# Patient Record
Sex: Female | Born: 1965 | Race: Black or African American | Hispanic: No | Marital: Single | Smoking: Current every day smoker
Health system: Southern US, Community
[De-identification: ages and names within clinical notes are randomized; demographics above are authoritative.]

## PROBLEM LIST (undated history)

## (undated) DIAGNOSIS — I10 Essential (primary) hypertension: Secondary | ICD-10-CM

## (undated) DIAGNOSIS — G43909 Migraine, unspecified, not intractable, without status migrainosus: Secondary | ICD-10-CM

## (undated) HISTORY — DX: Essential (primary) hypertension: I10

## (undated) HISTORY — PX: CHOLECYSTECTOMY: SHX55

---

## 2006-01-08 ENCOUNTER — Ambulatory Visit: Payer: Self-pay | Admitting: Emergency Medicine

## 2006-01-08 ENCOUNTER — Emergency Department: Payer: Self-pay | Admitting: Emergency Medicine

## 2006-01-18 ENCOUNTER — Emergency Department: Payer: Self-pay | Admitting: Unknown Physician Specialty

## 2007-07-30 ENCOUNTER — Emergency Department: Payer: Self-pay | Admitting: Emergency Medicine

## 2007-08-18 ENCOUNTER — Emergency Department: Payer: Self-pay | Admitting: Emergency Medicine

## 2008-02-09 ENCOUNTER — Emergency Department: Payer: Self-pay | Admitting: Emergency Medicine

## 2008-05-18 ENCOUNTER — Emergency Department: Payer: Self-pay | Admitting: Emergency Medicine
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
## 2008-09-30 ENCOUNTER — Emergency Department: Payer: Self-pay | Admitting: Emergency Medicine

## 2009-04-08 ENCOUNTER — Emergency Department: Payer: Self-pay | Admitting: Emergency Medicine

## 2010-10-19 ENCOUNTER — Emergency Department: Payer: Self-pay | Admitting: Emergency Medicine

## 2011-02-22 ENCOUNTER — Emergency Department: Payer: Self-pay | Admitting: *Deleted

## 2011-02-24 ENCOUNTER — Emergency Department: Payer: Self-pay | Admitting: Unknown Physician Specialty

## 2011-12-05 ENCOUNTER — Inpatient Hospital Stay: Payer: Self-pay | Admitting: Psychiatry

## 2011-12-05 LAB — ETHANOL: Ethanol %: <0.003 % (ref 0.000–0.080)

## 2011-12-05 LAB — DRUG SCREEN, URINE
Amphetamines, Ur Screen: NEGATIVE (ref ?–1000)
Barbiturates, Ur Screen: NEGATIVE (ref ?–200)
Cannabinoid 50 Ng, Ur ~~LOC~~: NEGATIVE (ref ?–50)
MDMA (Ecstasy)Ur Screen: NEGATIVE (ref ?–500)
Methadone, Ur Screen: NEGATIVE (ref ?–300)
Opiate, Ur Screen: POSITIVE (ref ?–300)
Tricyclic, Ur Screen: NEGATIVE (ref ?–1000)

## 2011-12-05 LAB — COMPREHENSIVE METABOLIC PANEL
Albumin: 4 g/dL (ref 3.4–5.0)
Alkaline Phosphatase: 111 U/L (ref 50–136)
BUN: 14 mg/dL (ref 7–18)
Bilirubin,Total: 0.7 mg/dL (ref 0.2–1.0)
Calcium, Total: 9.3 mg/dL (ref 8.5–10.1)
Co2: 25 mmol/L (ref 21–32)
Creatinine: 0.82 mg/dL (ref 0.60–1.30)
EGFR (African American): >60
SGOT(AST): 30 U/L (ref 15–37)
SGPT (ALT): 33 U/L
Sodium: 139 mmol/L (ref 136–145)

## 2011-12-05 LAB — ACETAMINOPHEN LEVEL: Acetaminophen: <2 ug/mL

## 2011-12-05 LAB — CBC
HGB: 14 g/dL (ref 12.0–16.0)
MCV: 99 fL (ref 80–100)
Platelet: 252 10*3/uL (ref 150–440)
RBC: 4.18 10*6/uL (ref 3.80–5.20)
WBC: 7.4 10*3/uL (ref 3.6–11.0)

## 2011-12-05 LAB — TSH: Thyroid Stimulating Horm: 0.32 u[IU]/mL — ABNORMAL LOW

## 2012-07-09 ENCOUNTER — Emergency Department: Payer: Self-pay | Admitting: Emergency Medicine

## 2013-03-09 ENCOUNTER — Emergency Department: Payer: Self-pay | Admitting: Emergency Medicine

## 2013-06-12 ENCOUNTER — Emergency Department: Payer: Self-pay | Admitting: Emergency Medicine

## 2013-06-12 LAB — COMPREHENSIVE METABOLIC PANEL
Alkaline Phosphatase: 113 U/L (ref 50–136)
Anion Gap: 6 — ABNORMAL LOW (ref 7–16)
BUN: 9 mg/dL (ref 7–18)
Bilirubin,Total: 1.3 mg/dL — ABNORMAL HIGH (ref 0.2–1.0)
Chloride: 108 mmol/L — ABNORMAL HIGH (ref 98–107)
Co2: 22 mmol/L (ref 21–32)
EGFR (African American): >60
EGFR (Non-African Amer.): >60
Glucose: 116 mg/dL — ABNORMAL HIGH (ref 65–99)
Potassium: 3.4 mmol/L — ABNORMAL LOW (ref 3.5–5.1)

## 2013-06-12 LAB — CBC
MCV: 95 fL (ref 80–100)
Platelet: 232 10*3/uL (ref 150–440)
RBC: 4.38 10*6/uL (ref 3.80–5.20)

## 2013-06-12 LAB — ETHANOL
Ethanol %: <0.003 % (ref 0.000–0.080)
Ethanol: <3 mg/dL

## 2013-09-30 ENCOUNTER — Emergency Department: Payer: Self-pay | Admitting: Emergency Medicine

## 2014-10-10 DIAGNOSIS — F17200 Nicotine dependence, unspecified, uncomplicated: Secondary | ICD-10-CM | POA: Diagnosis present

## 2014-11-25 NOTE — H&P (Signed)
PATIENT NAME:  Christina Avery, Christina Avery MR#:  [id number] DATE OF BIRTH:  10-06-1965  DATE OF ADMISSION:  12/05/2011  IDENTIFYING INFORMATION: The patient is a 49 year old African American female not employed and last worked in November of 2012. The patient is single, never married, and lives with her maternal grandmother who is 70 years old. The patient and maternal grandmother live in an apartment with two bedrooms and maternal grandmother rents it through the [employer] for 115 dollars a month. The patient comes for her first inpatient hospitalization on Psychiatry at [facility] with chief complaint "I have been using drugs and I want to get off of it. I have been having problems and I thought I better get here for help. My boyfriend drove me here for help and I have been having conflicts with him".  HISTORY OF PRESENT ILLNESS: When the patient was asked when she last felt well, she reported two days ago. She started having conflicts with her boyfriend. She reports that she has been with the same boyfriend for seven years and started having conflicts for the past few days. She has been feeling depressed and she reported "I can't tell you what exactly caused it. I cannot pick the cause why I feel so sad and down. I have been using cocaine and crack and smoking it for the past 24 years and smoke as much as I can and I started feeling depressed about it and want to get away from it".   PAST PSYCHIATRIC HISTORY: No history of inpatient hospitalization on Psychiatry. History of inpatient hospitalization for drug abuse and substance abuse problems on two occasions before. First inpatient hospitalization was in June of 1992 at [facility] in [place], [place] for 30 days. She stayed sober for less than 24 hours and started abusing crack soon after discharge. No specific stress. Second inpatient hospitalization was in 2008 at [facility] in [facility]. She was inpatient for five months. She  stayed sober for two months and started using crack again. She reported "just using it, no specific reason". Not being followed by any psychiatrist or any substance abuse program at this time.   FAMILY HISTORY OF MENTAL ILLNESS: None for mental illness. No suicides in the family.   FAMILY HISTORY: Raised by maternal grandmother. Mother always worked and never around. Mother is living. Father worked in [employer]. Father died of liver damage and was alcoholic, cannot remember his age. Has three brothers and one sister, close to family.   PERSONAL HISTORY: Born in [place], [place]. Graduated from high school. Went to college for 1-1/2 years for [employer]. No degree.   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 This job lasted for seven years. Quit because of drugs. Longest job she has ever held was at [employer]. 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 Currently employed and last worked on Thursday, 12/03/2011.   MARRIAGES: Never married. Dated a lot. No children. Had a son who died at age 22 years. Has a steady boyfriend for seven years. He is retired and is 36 years old.   ALCOHOL AND DRUGS: First drink of alcohol at age 89 years. No problem with alcohol drinking. No history of DWI's. Never arrested for public drunkenness. Denies smoking THC. Does admit smoking crack for the past 24 years. Smokes as much as she can depending on the money that she has. Last smoked it on 12/04/2011. Does admit smoking nicotine  cigarettes at a rate of a pack a day.   MEDICAL HISTORY: No high blood pressure. No diabetes. Status post two Cesarean sections. Status post cholecystectomy. Status post surgery for bowel obstruction. No history of motor vehicle accident. Never been unconscious.   ALLERGIES: No known drug allergies.   MEDICATIONS: Not on any medications at this time.   PRIMARY CARE PHYSICIAN: Not being followed by any physician at this time.   PHYSICAL  EXAMINATION:   VITAL SIGNS: Temperature 98.7, pulse 120 per minute and regular, respirations 20 and regular, blood pressure 140/94 mmHg.   HEENT: Head is normocephalic and atraumatic. Pupils equal, round, and reactive to light and accommodation. Fundi bilaterally benign. EOMS visualized. Tympanic membranes visualized. No exudate.   NECK: Supple without any organomegaly or thyromegaly.  CHEST: Normal expansion.   HEART: Normal S1, S2 without any murmurs or gallops.  ABDOMEN: Soft. No organomegaly. Bowel sounds heard.   RECTAL/PELVIC: Deferred.   NEUROLOGIC: Gait is normal. Romberg is negative. Cranial nerves II through XII grossly intact. Deep tendon reflexes 2+. Normal plantars. Normal response.   MENTAL STATUS EXAMINATION: The patient is dressed in hospital pajamas, alert and oriented to place, person, and time. Fully aware of the situation that brought her for admission to [facility]. Affect is appropriate with her mood which is low and down. Does admit feeling depressed. Admits feeling hopeless and helpless about crack addiction. Admits feeling worthless and useless at times about her crack addiction. Denies any ideas to hurt herself or others. No evidence of psychosis. Denies auditory or visual hallucinations. Denies hearing voices or seeing things. Denies paranoid or suspicious ideas. Denies thought insertion or thought control. Denies having any grandiose ideas. Memory is intact for recent and remote events. Recall is good and could remember all the three objects after several minutes. She could spell the word world forward, could not spell it backward. She could count money. Abstract interpretation is fair. General knowledge and information is fair. Cognition intact. Does admit to appetite and sleep disturbance when she's taking too much crack and drinks alcohol on top of it. Denies any ideas to hurt herself or others and wants to get help. Insight and judgment guarded.   IMPRESSION: AXIS  I: 1. Crack dependence, chronic, continuous. 2. Alcohol abuse. 3. Nicotine dependence. 4. Substance-induced mood disorder.  AXIS II: Deferred.   AXIS III: 1. Status post cholecystectomy. 2. Status post surgery for bowel obstruction. 3. Status post two Cesarean sections.  4. Status post surgery for ectopic pregnancy.   AXIS IV: Severe. Long history of crack dependence and problems related to the same which includes occupational, financial, and relationship problems with boyfriend of seven years.  AXIS V: GAF 25.  PLAN: The patient is admitted to [facility] for close observation, evaluation, and help. She will be started on detox on a p.r.n. basis for crack dependence and alcohol abuse. She will be started on antidepressant medication which will help her with her depression and help her rest better. During the stay in the hospital, she will be given milieu therapy and supportive counseling. She will take part in individual and group therapy for substance abuse where focus on crack dependence will be addressed. At the time of discharge, she will be detoxed and her symptoms will be under control. Social Services will consult for programs in the community that will give her help on an outpatient basis for her crack dependence as patient wants to get help on an outpatient basis as she wants  to work during the day. Appropriate follow-up appointments will be made at the time of discharge.  ____________________________ Jannet Mantis. Guss Bunde, MD skc:drc D: 12/05/2011 18:57:57 ET T: 12/06/2011 08:17:43 ET JOB#: [id number]  cc: Monika Salk K. Guss Bunde, MD, <Dictator> Beau Fanny MD ELECTRONICALLY SIGNED 12/06/2011 17:05

## 2015-04-15 ENCOUNTER — Emergency Department
Admission: EM | Admit: 2015-04-15 | Discharge: 2015-04-15 | Disposition: A | Discharge status: Home / Self Care | Payer: BLUE CROSS/BLUE SHIELD | Attending: Emergency Medicine | Admitting: Emergency Medicine

## 2015-04-15 ENCOUNTER — Encounter: Payer: Self-pay | Admitting: Emergency Medicine

## 2015-04-15 DIAGNOSIS — S81811A Laceration without foreign body, right lower leg, initial encounter: Secondary | ICD-10-CM | POA: Insufficient documentation

## 2015-04-15 DIAGNOSIS — Y998 Other external cause status: Secondary | ICD-10-CM | POA: Diagnosis not present

## 2015-04-15 DIAGNOSIS — Z720 Tobacco use: Secondary | ICD-10-CM | POA: Diagnosis not present

## 2015-04-15 DIAGNOSIS — IMO0002 Reserved for concepts with insufficient information to code with codable children: Secondary | ICD-10-CM

## 2015-04-15 DIAGNOSIS — Z23 Encounter for immunization: Secondary | ICD-10-CM | POA: Insufficient documentation

## 2015-04-15 DIAGNOSIS — Y288XXA Contact with other sharp object, undetermined intent, initial encounter: Secondary | ICD-10-CM | POA: Insufficient documentation

## 2015-04-15 DIAGNOSIS — Y9289 Other specified places as the place of occurrence of the external cause: Secondary | ICD-10-CM | POA: Insufficient documentation

## 2015-04-15 DIAGNOSIS — Y9389 Activity, other specified: Secondary | ICD-10-CM | POA: Insufficient documentation

## 2015-04-15 MED ORDER — OXYCODONE-ACETAMINOPHEN 5-325 MG PO TABS
2.0000 | ORAL_TABLET | Freq: Once | ORAL | Status: AC
  Administered 2015-04-15: 2 via ORAL
  Filled 2015-04-15: qty 2

## 2015-04-15 MED ORDER — TETANUS-DIPHTH-ACELL PERTUSSIS 5-2.5-18.5 LF-MCG/0.5 IM SUSP
0.5000 mL | Freq: Once | INTRAMUSCULAR | Status: AC
  Administered 2015-04-15: 0.5 mL via INTRAMUSCULAR
  Filled 2015-04-15: qty 0.5

## 2015-04-15 MED ORDER — LIDOCAINE-EPINEPHRINE 1 %-1:100000 IJ SOLN
10.0000 mL | Freq: Once | INTRAMUSCULAR | Status: DC
  Filled 2015-04-15: qty 10

## 2015-04-15 MED ORDER — IBUPROFEN 800 MG PO TABS: 800.0000 mg | ORAL_TABLET | Freq: Three times a day (TID) | ORAL | Status: DC | PRN

## 2015-04-15 MED ORDER — LIDOCAINE-EPINEPHRINE (PF) 1 %-1:200000 IJ SOLN
INTRAMUSCULAR | Status: AC
  Filled 2015-04-15: qty 30

## 2015-04-15 NOTE — Discharge Instructions (Signed)
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
## 2015-04-15 NOTE — ED Notes (Signed)
Pt got leg caught on metal piece of furniture, lac to lower leg, bleeding controled

## 2015-04-15 NOTE — ED Notes (Signed)
Pt presents with laceration to right leg up near knee. Caught it on a nail sticking out of her couch.

## 2015-12-04 DIAGNOSIS — Y92410 Unspecified street and highway as the place of occurrence of the external cause: Secondary | ICD-10-CM | POA: Insufficient documentation

## 2015-12-04 DIAGNOSIS — Z79899 Other long term (current) drug therapy: Secondary | ICD-10-CM | POA: Insufficient documentation

## 2015-12-04 DIAGNOSIS — S169XXA Unspecified injury of muscle, fascia and tendon at neck level, initial encounter: Secondary | ICD-10-CM | POA: Diagnosis present

## 2015-12-04 DIAGNOSIS — S134XXA Sprain of ligaments of cervical spine, initial encounter: Secondary | ICD-10-CM | POA: Insufficient documentation

## 2015-12-04 DIAGNOSIS — F17200 Nicotine dependence, unspecified, uncomplicated: Secondary | ICD-10-CM | POA: Diagnosis not present

## 2015-12-04 DIAGNOSIS — Y9389 Activity, other specified: Secondary | ICD-10-CM | POA: Diagnosis not present

## 2015-12-04 DIAGNOSIS — Y999 Unspecified external cause status: Secondary | ICD-10-CM | POA: Insufficient documentation
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
## 2015-12-05 ENCOUNTER — Emergency Department
Admission: EM | Admit: 2015-12-05 | Discharge: 2015-12-05 | Disposition: A | Discharge status: Home / Self Care | Payer: BLUE CROSS/BLUE SHIELD | Attending: Emergency Medicine | Admitting: Emergency Medicine

## 2015-12-05 DIAGNOSIS — S134XXA Sprain of ligaments of cervical spine, initial encounter: Secondary | ICD-10-CM

## 2015-12-05 MED ORDER — CARISOPRODOL 350 MG PO TABS: 350.0000 mg | ORAL_TABLET | Freq: Three times a day (TID) | ORAL | Status: DC | PRN

## 2015-12-05 NOTE — Discharge Instructions (Signed)
Cervical Sprain  A cervical sprain is an injury in the neck in which the strong, fibrous tissues (ligaments) that connect your neck bones stretch or tear. Cervical sprains can range from mild to severe. Severe cervical sprains can cause the neck vertebrae to be unstable. This can lead to damage of the spinal cord and can result in serious nervous system problems. The amount of time it takes for a cervical sprain to get better depends on the cause and extent of the injury. Most cervical sprains heal in 1 to 3 weeks.  CAUSES   Severe cervical sprains may be caused by:    Contact sport injuries (such as from football, rugby, wrestling, hockey, auto racing, gymnastics, diving, martial arts, or boxing).    Motor vehicle collisions.    Whiplash injuries. This is an injury from a sudden forward and backward whipping movement of the head and neck.   Falls.   Mild cervical sprains may be caused by:    Being in an awkward position, such as while cradling a telephone between your ear and shoulder.    Sitting in a chair that does not offer proper support.    Working at a poorly designed computer station.    Looking up or down for long periods of time.   SYMPTOMS    Pain, soreness, stiffness, or a burning sensation in the front, back, or sides of the neck. This discomfort may develop immediately after the injury or slowly, 24 hours or more after the injury.    Pain or tenderness directly in the middle of the back of the neck.    Shoulder or upper back pain.    Limited ability to move the neck.    Headache.    Dizziness.    Weakness, numbness, or tingling in the hands or arms.    Muscle spasms.    Difficulty swallowing or chewing.    Tenderness and swelling of the neck.   DIAGNOSIS   Most of the time your health care provider can diagnose a cervical sprain by taking your history and doing a physical exam. Your health care provider will ask about previous neck injuries and any known neck  problems, such as arthritis in the neck. X-rays may be taken to find out if there are any other problems, such as with the bones of the neck. Other tests, such as a CT scan or MRI, may also be needed.   TREATMENT   Treatment depends on the severity of the cervical sprain. Mild sprains can be treated with rest, keeping the neck in place (immobilization), and pain medicines. Severe cervical sprains are immediately immobilized. Further treatment is done to help with pain, muscle spasms, and other symptoms and may include:   Medicines, such as pain relievers, numbing medicines, or muscle relaxants.    Physical therapy. This may involve stretching exercises, strengthening exercises, and posture training. Exercises and improved posture can help stabilize the neck, strengthen muscles, and help stop symptoms from returning.   HOME CARE INSTRUCTIONS    Put ice on the injured area.     Put ice in a plastic bag.     Place a towel between your skin and the bag.     Leave the ice on for 15-20 minutes, 3-4 times a day.    If your injury was severe, you may have been given a cervical collar to wear. A cervical collar is a two-piece collar designed to keep your neck from moving while it heals.      Do not remove the collar unless instructed by your health care provider.    If you have long hair, keep it outside of the collar.    Ask your health care provider before making any adjustments to your collar. Minor adjustments may be required over time to improve comfort and reduce pressure on your chin or on the back of your head.    Ifyou are allowed to remove the collar for cleaning or bathing, follow your health care provider's instructions on how to do so safely.    Keep your collar clean by wiping it with mild soap and water and drying it completely. If the collar you have been given includes removable pads, remove them every 1-2 days and hand wash them with soap and water. Allow them to air dry. They should be completely  dry before you wear them in the collar.    If you are allowed to remove the collar for cleaning and bathing, wash and dry the skin of your neck. Check your skin for irritation or sores. If you see any, tell your health care provider.    Do not drive while wearing the collar.    Only take over-the-counter or prescription medicines for pain, discomfort, or fever as directed by your health care provider.    Keep all follow-up appointments as directed by your health care provider.    Keep all physical therapy appointments as directed by your health care provider.    Make any needed adjustments to your workstation to promote good posture.    Avoid positions and activities that make your symptoms worse.    Warm up and stretch before being active to help prevent problems.   SEEK MEDICAL CARE IF:    Your pain is not controlled with medicine.    You are unable to decrease your pain medicine over time as planned.    Your activity level is not improving as expected.   SEEK IMMEDIATE MEDICAL CARE IF:    You develop any bleeding.   You develop stomach upset.   You have signs of an allergic reaction to your medicine.    Your symptoms get worse.    You develop new, unexplained symptoms.    You have numbness, tingling, weakness, or paralysis in any part of your body.   MAKE SURE YOU:    Understand these instructions.   Will watch your condition.   Will get help right away if you are not doing well or get worse.     This information is not intended to replace advice given to you by your health care provider. Make sure you discuss any questions you have with your health care provider.     Document Released: 05/17/2007 Document Revised: 07/25/2013 Document Reviewed: 01/25/2013  Elsevier Interactive Patient Education 2016 [employer]
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
## 2016-03-18 ENCOUNTER — Emergency Department
Admission: EM | Admit: 2016-03-18 | Discharge: 2016-03-18 | Disposition: A | Discharge status: Home / Self Care | Payer: BLUE CROSS/BLUE SHIELD

## 2016-08-27 ENCOUNTER — Emergency Department
Admission: EM | Admit: 2016-08-27 | Discharge: 2016-08-27 | Disposition: A | Discharge status: Home / Self Care | Payer: BLUE CROSS/BLUE SHIELD | Attending: Emergency Medicine | Admitting: Emergency Medicine

## 2016-08-27 ENCOUNTER — Emergency Department: Payer: BLUE CROSS/BLUE SHIELD

## 2016-08-27 ENCOUNTER — Encounter: Payer: Self-pay | Admitting: Emergency Medicine

## 2016-08-27 DIAGNOSIS — F17200 Nicotine dependence, unspecified, uncomplicated: Secondary | ICD-10-CM | POA: Diagnosis not present

## 2016-08-27 DIAGNOSIS — R51 Headache: Secondary | ICD-10-CM | POA: Insufficient documentation

## 2016-08-27 DIAGNOSIS — Z79899 Other long term (current) drug therapy: Secondary | ICD-10-CM | POA: Insufficient documentation

## 2016-08-27 DIAGNOSIS — R519 Headache, unspecified: Secondary | ICD-10-CM

## 2016-08-27 DIAGNOSIS — Z791 Long term (current) use of non-steroidal anti-inflammatories (NSAID): Secondary | ICD-10-CM | POA: Insufficient documentation

## 2016-08-27 LAB — BASIC METABOLIC PANEL
Anion gap: 6 (ref 5–15)
BUN: 8 mg/dL (ref 6–20)
CALCIUM: 9.2 mg/dL (ref 8.9–10.3)
CO2: 25 mmol/L (ref 22–32)
CREATININE: 0.65 mg/dL (ref 0.44–1.00)
Chloride: 105 mmol/L (ref 101–111)
GFR calc Af Amer: >60 mL/min (ref >60)
GLUCOSE: 94 mg/dL (ref 65–99)
Potassium: 3.9 mmol/L (ref 3.5–5.1)
Sodium: 136 mmol/L (ref 135–145)

## 2016-08-27 LAB — CBC WITH DIFFERENTIAL/PLATELET
BASOS ABS: 0.1 10*3/uL (ref 0–0.1)
BASOS PCT: 1 %
EOS PCT: 3 %
Eosinophils Absolute: 0.1 10*3/uL (ref 0–0.7)
HCT: 39.6 % (ref 35.0–47.0)
Hemoglobin: 13.9 g/dL (ref 12.0–16.0)
Lymphocytes Relative: 46 %
Lymphs Abs: 2.5 10*3/uL (ref 1.0–3.6)
MCH: 32.9 pg (ref 26.0–34.0)
MCHC: 35.1 g/dL (ref 32.0–36.0)
MCV: 93.8 fL (ref 80.0–100.0)
MONO ABS: 0.5 10*3/uL (ref 0.2–0.9)
Monocytes Relative: 9 %
Neutro Abs: 2.2 10*3/uL (ref 1.4–6.5)
Neutrophils Relative %: 41 %
PLATELETS: 231 10*3/uL (ref 150–440)
RBC: 4.22 MIL/uL (ref 3.80–5.20)
RDW: 12.7 % (ref 11.5–14.5)
WBC: 5.3 10*3/uL (ref 3.6–11.0)

## 2016-08-27 LAB — CARBOXYHEMOGLOBIN - COOX: Carboxyhemoglobin: 4.8 % — ABNORMAL HIGH (ref 0.5–1.5)

## 2016-08-27 LAB — POCT PREGNANCY, URINE: PREG TEST UR: NEGATIVE

## 2016-08-27 MED ORDER — KETOROLAC TROMETHAMINE 30 MG/ML IJ SOLN
30.0000 mg | Freq: Once | INTRAMUSCULAR | Status: AC
  Administered 2016-08-27: 30 mg via INTRAVENOUS
  Filled 2016-08-27: qty 1

## 2016-08-27 MED ORDER — METOCLOPRAMIDE HCL 5 MG/ML IJ SOLN
10.0000 mg | Freq: Once | INTRAMUSCULAR | Status: AC
  Administered 2016-08-27: 10 mg via INTRAVENOUS
  Filled 2016-08-27: qty 2

## 2016-08-27 MED ORDER — SODIUM CHLORIDE 0.9 % IV BOLUS (SEPSIS)
1000.0000 mL | Freq: Once | INTRAVENOUS | Status: AC
  Administered 2016-08-27: 1000 mL via INTRAVENOUS

## 2016-08-27 NOTE — ED Notes (Signed)
Pt noted sleeping soundly in lobby; resp even/unlab

## 2016-08-27 NOTE — Discharge Instructions (Signed)

## 2016-08-27 NOTE — ED Notes (Addendum)
Report from Kim, RN

## 2016-08-27 NOTE — ED Notes (Signed)
Pt reports right sided HA x 2 days, reports sensation of burning in her nose and pain behind her right eye w/ sensitivity to light.  Pt NAD upon assessment, resp equal and unlabored, skin warm and dry.

## 2016-08-27 NOTE — ED Notes (Signed)
Pt taken to MRI by this RN.  

## 2016-08-27 NOTE — ED Notes (Signed)
Family at bedside. 

## 2016-08-27 NOTE — ED Provider Notes (Signed)
[facility] Emergency Department Provider Note  ____________________________________________  Time seen: Approximately 7:30 AM  I have reviewed the triage vital signs and the nursing notes.   HISTORY  Chief Complaint Migraine   HPI Christina Avery is a 51 y.o. female each of migraine headaches who presents for evaluation of a headache. Patient reports for the last 2 days she has had a right-sided headache that she describes as severe, sharp, located in the R side of the head and face and behind her right eye, intermittent, and non radiating. Patient reports that her prior migraine headaches for usually diffuse/global and different than this one. Patient reports that on the last 2 nights she woke up in the middle of the night with a severe right-sided headache. She takes Tylenol and ibuprofen and the headaches resolve and she usually does not have any throughout the day. She denies any new heating system in her house. Her boyfriend and dog have not had any recent illnesses. No fever, neck stiffness, or rash. Patient denies any past medical history her however endorses a history of clotting disorders in her sister. She does not take any hormones. She reports that she feels that the HA is behind her R eye. NO congestion or sore throat. Currently HA is 8/10. No thunderclap component.   History reviewed. No pertinent past medical history.  There are no active problems to display for this patient.   Past Surgical History:  Procedure Laterality Date  . CESAREAN SECTION    . CHOLECYSTECTOMY      Prior to Admission medications   Medication Sig Start Date End Date Taking? Authorizing Provider  acetaminophen (TYLENOL) 500 MG tablet Take 500 mg by mouth every 6 (six) hours as needed.   Yes Historical Provider, MD  CALCIUM PO Take 1 tablet by mouth daily.   Yes Historical Provider, MD  ferrous sulfate 325 (65 FE) MG tablet Take 325 mg by mouth daily with breakfast.    Yes Historical Provider, MD  ibuprofen (ADVIL,MOTRIN) 200 MG tablet Take 200 mg by mouth every 6 (six) hours as needed.   Yes Historical Provider, MD  Multiple Vitamins-Minerals (MULTIVITAMIN WITH MINERALS) tablet Take 1 tablet by mouth daily.   Yes Historical Provider, MD  omeprazole (PRILOSEC) 40 MG capsule Take 40 mg by mouth daily.   Yes Historical Provider, MD  terbinafine (LAMISIL) 250 MG tablet Take 250 mg by mouth daily.   Yes Historical Provider, MD  carisoprodol (SOMA) 350 MG tablet Take 1 tablet (350 mg total) by mouth 3 (three) times daily as needed for muscle spasms. 12/05/15   Myrna Blazer, MD    Allergies Patient has no known allergies.  No family history on file.  Social History Social History  Substance Use Topics  . Smoking status: Current Every Day Smoker  . Smokeless tobacco: Never Used  . Alcohol use No    Review of Systems  Constitutional: Negative for fever. Eyes: Negative for visual changes. ENT: Negative for sore throat. Neck: No neck pain  Cardiovascular: Negative for chest pain. Respiratory: Negative for shortness of breath. Gastrointestinal: Negative for abdominal pain, vomiting or diarrhea. Genitourinary: Negative for dysuria. Musculoskeletal: Negative for back pain. Skin: Negative for rash. Neurological: Negative for weakness or numbness. + HA Psych: No SI or HI  ____________________________________________   PHYSICAL EXAM:  VITAL SIGNS: ED Triage Vitals  Enc Vitals Group     BP 08/27/16 0329 (!) 147/93     Pulse Rate 08/27/16 0329 78  Resp 08/27/16 0329 18     Temp 08/27/16 0329 97.9 F (36.6 C)     Temp Source 08/27/16 0329 Oral     SpO2 08/27/16 0329 98 %     Weight 08/27/16 0327 231 lb (104.8 kg)     Height 08/27/16 0327 6\' 3"  (1.905 m)     Head Circumference --      Peak Flow --      Pain Score 08/27/16 0327 9     Pain Loc --      Pain Edu? --      Excl. in GC? --     Constitutional: Alert and oriented. Well  appearing and in no apparent distress. HEENT:      Head: Normocephalic and atraumatic.         Eyes: Conjunctivae are normal. Sclera is non-icteric. EOMI. PERRL      Mouth/Throat: Mucous membranes are moist.       Neck: Supple with no signs of meningismus. Cardiovascular: Regular rate and rhythm. No murmurs, gallops, or rubs. 2+ symmetrical distal pulses are present in all extremities. No JVD. Respiratory: Normal respiratory effort. Lungs are clear to auscultation bilaterally. No wheezes, crackles, or rhonchi.  Gastrointestinal: Soft, non tender, and non distended with positive bowel sounds. No rebound or guarding. Genitourinary: No CVA tenderness. Musculoskeletal: Nontender with normal range of motion in all extremities. No edema, cyanosis, or erythema of extremities. Neurologic: Normal speech and language. A & O x3, PERRL, no nystagmus, CN II-XII intact, motor testing reveals good tone and bulk throughout. There is no evidence of pronator drift or dysmetria. Muscle strength is 5/5 throughout. Deep tendon reflexes are 2+ throughout with downgoing toes. Sensory examination is intact. Gait is normal. Skin: Skin is warm, dry and intact. No rash noted. Psychiatric: Mood and affect are normal. Speech and behavior are normal.  ____________________________________________   LABS (all labs ordered are listed, but only abnormal results are displayed)  Labs Reviewed  CARBOXYHEMOGLOBIN - COOX - Abnormal; Notable for the following:       Result Value   Carboxyhemoglobin 4.8 (*)    All other components within normal limits  BASIC METABOLIC PANEL  CBC WITH DIFFERENTIAL/PLATELET  POCT PREGNANCY, URINE   ____________________________________________  EKG  none ____________________________________________  RADIOLOGY  MRA/MRV: MR Brain Wo Contrast (Final result)  Result time 08/27/16 10:07:40  Final result by Elie Goody, MD (08/27/16 10:07:40)           Narrative:   CLINICAL DATA:  Right-sided headache. Coagulable disorder.  EXAM: MRI HEAD WITHOUT CONTRAST  MRV HEAD WITHOUT CONTRAST  TECHNIQUE: Multiplanar, multiecho pulse sequences of the brain and surrounding structures were obtained without intravenous contrast. Angiographic images of the intracranial venous structures were obtained using MRV technique without intravenous contrast.  COMPARISON: None.  FINDINGS: Ventricle size normal. Cerebral volume normal. Negative for acute infarct. Patchy hyperintensity in the pons bilaterally. Several small hyperintensities in the right anterior basal ganglia and deep white matter on the right.  Negative for hemorrhage. Negative for mass. Pituitary normal in size.  Normal arterial flow void.  Skeletal structures intact.  Paranasal sinuses clear. Normal orbit.  MR venogram demonstrates normal superior sagittal sinus. Internal cerebral veins and straight sinus patent.  Right transverse sinus is patent with segmental narrowing in the distal segment. Left sigmoid sinus widely patent.  Proximal right Transverse sinus is not patent. 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 Review of the MRI findings reveals no evidence of thrombosis in the right  transverse sinus. 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  IMPRESSION: Negative for acute infarct.  Patchy hyperintensity in the pons. Mild hyperintensity in the right anterior basal ganglia and cerebral white matter most consistent with chronic microvascular ischemia.  Right transverse sinus is hypoplastic proximally with a small distal transverse sinus and sigmoid sinus on the right. There is a mild to moderate stenosis of the distal left transverse sinus. No evidence of sinus thrombosis.   Electronically Signed By: Marlan Palau M.D. On: 08/27/2016 10:07           ____________________________________________   PROCEDURES  Procedure(s) performed: None Procedures Critical Care performed:  None ____________________________________________   INITIAL IMPRESSION / ASSESSMENT AND PLAN / ED COURSE   51 y.o. female each of migraine headaches who presents for evaluation of a R sided headache behind her R eye for 2 days worse at night with fh of coagulable disorder. No thunderclap headache, she is neurologically intact and well appearing, eye exam with no acute findings. No changes in vision or temporal artery ttp. Ddx sinusitis vs migraine vs CVT with fh of coagulable disorder and pain located in behind her eye. We'll get MRI MRV, check basic blood work, check pregnancy test. Will check carboxyhemoglobin as patient reports that the headache is only present in her house. We'll give IV fluids, IV Toradol, IV Reglan, and reassess.  Clinical Course as of Aug 27 1310  Thu Aug 27, 2016  1310 Venous carboxyhemoglobin of 4.8, remaining of her blood work and no acute findings. MRI and MRV results were discussed with Dr. Emmaline Life from neurology who does not believe any of those findings are acute or could be causing patient's headache. 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 She recommended patient follow-up with her primary care doctor. Patient's headache has fully resolved in the emergency room she remains well appearing and neurologically intact. She will follow-up with her PCP tomorrow or Monday. She'll be discharged home at this time.  [CV]    Clinical Course User Index [CV] Nita Sickle, MD    Pertinent labs & imaging results that were available during my care of the patient were reviewed by me and considered in my medical decision making (see chart for details).    ____________________________________________   FINAL CLINICAL IMPRESSION(S) / ED DIAGNOSES  Final  diagnoses:  Headache  Acute nonintractable headache, unspecified headache type      NEW MEDICATIONS STARTED DURING THIS VISIT:  New Prescriptions   No medications on file     Note:  This document was prepared using Dragon voice recognition software and may include unintentional dictation errors.    Nita Sickle, MD 08/27/16 [phone]

## 2016-08-27 NOTE — ED Notes (Signed)
Pt given graham crackers and drink. States she has not eaten since 3AM. Pt awaiting update from MD. TV turned on per request.

## 2016-08-27 NOTE — ED Notes (Signed)
Pt family member given crackers and snack. Pt ambulatory in room. Awaiting EDP update.

## 2016-08-27 NOTE — ED Notes (Addendum)
Pt awaiting EDP re-eval.

## 2016-08-27 NOTE — ED Triage Notes (Addendum)
Patient ambulatory to triage with steady gait, without difficulty or distress noted; pt reports x 2 days having right sided HA; st hx of migraines but not recently and doesn't feel same; pt denies any accomp symptoms, denies any recent illness

## 2016-08-27 NOTE — ED Notes (Signed)
Pt alert and oriented X4, active, cooperative, pt in NAD. RR even and unlabored, color WNL.  Pt informed to return if any life threatening symptoms occur.   

## 2016-09-10 ENCOUNTER — Emergency Department
Admission: EM | Admit: 2016-09-10 | Discharge: 2016-09-10 | Disposition: A | Discharge status: Home / Self Care | Payer: BLUE CROSS/BLUE SHIELD | Attending: Emergency Medicine | Admitting: Emergency Medicine

## 2016-09-10 DIAGNOSIS — Z79899 Other long term (current) drug therapy: Secondary | ICD-10-CM | POA: Diagnosis not present

## 2016-09-10 DIAGNOSIS — R51 Headache: Secondary | ICD-10-CM | POA: Insufficient documentation

## 2016-09-10 DIAGNOSIS — F17200 Nicotine dependence, unspecified, uncomplicated: Secondary | ICD-10-CM | POA: Diagnosis not present

## 2016-09-10 DIAGNOSIS — R112 Nausea with vomiting, unspecified: Secondary | ICD-10-CM | POA: Insufficient documentation

## 2016-09-10 DIAGNOSIS — H5711 Ocular pain, right eye: Secondary | ICD-10-CM | POA: Diagnosis not present

## 2016-09-10 DIAGNOSIS — R519 Headache, unspecified: Secondary | ICD-10-CM

## 2016-09-10 DIAGNOSIS — Z791 Long term (current) use of non-steroidal anti-inflammatories (NSAID): Secondary | ICD-10-CM | POA: Insufficient documentation

## 2016-09-10 HISTORY — DX: Migraine, unspecified, not intractable, without status migrainosus: G43.909

## 2016-09-10 LAB — SEDIMENTATION RATE: Sed Rate: 22 mm/hr — ABNORMAL HIGH (ref 0–20)

## 2016-09-10 MED ORDER — KETOROLAC TROMETHAMINE 30 MG/ML IJ SOLN
30.0000 mg | Freq: Once | INTRAMUSCULAR | Status: AC
  Administered 2016-09-10: 30 mg via INTRAVENOUS
  Filled 2016-09-10: qty 1

## 2016-09-10 MED ORDER — CARBAMAZEPINE ER 100 MG PO TB12: 100.0000 mg | ORAL_TABLET | Freq: Two times a day (BID) | ORAL | 0 refills | Status: DC

## 2016-09-10 MED ORDER — DEXAMETHASONE SODIUM PHOSPHATE 10 MG/ML IJ SOLN
10.0000 mg | Freq: Once | INTRAMUSCULAR | Status: AC
  Administered 2016-09-10: 10 mg via INTRAVENOUS
  Filled 2016-09-10: qty 1

## 2016-09-10 MED ORDER — MAGNESIUM SULFATE IN D5W 1-5 GM/100ML-% IV SOLN
1.0000 g | Freq: Once | INTRAVENOUS | Status: AC
  Administered 2016-09-10: 1 g via INTRAVENOUS
  Filled 2016-09-10: qty 100

## 2016-09-10 MED ORDER — SODIUM CHLORIDE 0.9 % IV BOLUS (SEPSIS)
1000.0000 mL | Freq: Once | INTRAVENOUS | Status: AC
  Administered 2016-09-10: 1000 mL via INTRAVENOUS

## 2016-09-10 MED ORDER — PROCHLORPERAZINE EDISYLATE 5 MG/ML IJ SOLN
10.0000 mg | Freq: Once | INTRAMUSCULAR | Status: AC
  Administered 2016-09-10: 10 mg via INTRAVENOUS
  Filled 2016-09-10: qty 2

## 2016-09-10 MED ORDER — PROCHLORPERAZINE MALEATE 10 MG PO TABS: 10.0000 mg | ORAL_TABLET | Freq: Four times a day (QID) | ORAL | 0 refills | Status: DC | PRN

## 2016-09-10 MED ORDER — CARBAMAZEPINE 200 MG PO TABS
200.0000 mg | ORAL_TABLET | Freq: Once | ORAL | Status: AC
  Administered 2016-09-10: 200 mg via ORAL
  Filled 2016-09-10: qty 1

## 2016-09-10 NOTE — Discharge Instructions (Signed)
The headache and face pain that you are experiencing may be due to trigeminal neuralgia. Please start taking the carbamazepine, which should help your symptoms. Make a follow-up appointment with a neurologist for further evaluation.  Please seen ophthalmologist tomorrow if possible for your eye pain and blurry vision.  Return to the emergency department if you develop worsening symptoms, inability to walk, numbness tingling or weakness, vomiting, fever, or any other symptoms concerning to you.

## 2016-09-10 NOTE — ED Provider Notes (Signed)
[facility] Emergency Department Provider Note  ____________________________________________  Time seen: Approximately 12:41 PM  I have reviewed the triage vital signs and the nursing notes.   HISTORY  Chief Complaint Migraine    HPI Christina Avery is a 51 y.o. female a history of migraines presenting for headache, right facial pain, and orbital pain. The patient reports that since late January, she's been having intermittent but daily severe headaches. She describes severe pain in the right scalp, which radiates to the right temple, and the right face. She has pain behind the right eye and pain with lateral eye movements to the right. This is associated with some blurred vision. Today, she also developed nausea and vomiting. No fever, chills, trauma. The patient was evaluated and 08/27/16 for similar symptoms and had a reassuring workup with significant dramatic relief with medications, and an MR and MRV which did not show any acute findings. The patient has tried Imitrex without any improvement.Holding her head in a right lateral position helps with the pain. The patient denies any cough or cold symptoms, sore throat, dental pain, or ear pain. No lacrimation. These headaches are different than her previous migraines.   Past Medical History:  Diagnosis Date  . Migraines     There are no active problems to display for this patient.   Past Surgical History:  Procedure Laterality Date  . CESAREAN SECTION    . CHOLECYSTECTOMY      Current Outpatient Rx  . Order #: [id number] Class: Historical Med  . Order #: [id number] Class: Historical Med  . Order #: [id number] Class: Print  . Order #: [id number] Class: Print  . Order #: [id number] Class: Historical Med  . Order #: [id number] Class: Historical Med  . Order #: [id number] Class: Historical Med  . Order #: [id number] Class: Historical Med  . Order #: [id number] Class: Print  . Order #: [id number] Class: Historical Med     Allergies Patient has no known allergies.  History reviewed. No pertinent family history.  Social History Social History  Substance Use Topics  . Smoking status: Current Every Day Smoker  . Smokeless tobacco: Never Used  . Alcohol use No    Review of Systems Constitutional: No fever/chills.Positive general malaise. No lightheadedness or syncope. Eyes: Positive blurred vision. No double vision. ENT: No sore throat. No congestion or rhinorrhea. No ear pain. Cardiovascular: Denies chest pain. Denies palpitations. Respiratory: Denies shortness of breath.  No cough. Gastrointestinal: No abdominal pain.  No nausea, no vomiting.  No diarrhea.  No constipation. Genitourinary: Negative for dysuria. Musculoskeletal: Negative for back pain. Skin: Negative for rash. Neurological: Positive for headaches. No focal numbness, tingling or weakness. No difficulty walking. No facial pain. Positive blurred vision  10-point ROS otherwise negative.  ____________________________________________   PHYSICAL EXAM:  VITAL SIGNS: ED Triage Vitals  Enc Vitals Group     BP 09/10/16 1204 (!) 145/108     Pulse Rate 09/10/16 1204 69     Resp 09/10/16 1204 18     Temp 09/10/16 1204 97.9 F (36.6 C)     Temp Source 09/10/16 1204 Oral     SpO2 09/10/16 1204 99 %     Weight --      Height --      Head Circumference --      Peak Flow --      Pain Score 09/10/16 1205 10     Pain Loc --      Pain Edu? --      Excl. in GC? --  Constitutional: Alert and oriented. Uncomfortable appearing but nontoxic. Answers questions appropriately. Eyes: Conjunctivae are normal.  EOMI. PERRLA. Reports pain with right lateral gaze but there is no evidence of entrapment. No scleral icterus. Head: Atraumatic. No Battle sign. No raccoon eyes. Positive tenderness over the entirety of the right face, including the temple. Positive tenderness over the right scalp as well. No skin changes, overlying swelling or  erythema, or ecchymosis. Nose: No congestion/rhinnorhea. Mouth/Throat: Mucous membranes are moist. Posterior pharyngeal erythema, tonsillar swelling or exudate. No dental abnormalities. Neck: No stridor.  Supple.  Full range of motion without pain. The patient does maintain a right lateral positioning of the neck which she states alleviates her pain but does have full range of motion without meningismus. Cardiovascular: Normal rate, regular rhythm. No murmurs, rubs or gallops.  Respiratory: Normal respiratory effort.  No accessory muscle use or retractions. Lungs CTAB.  No wheezes, rales or ronchi. Gastrointestinal: Soft, nontender and nondistended.  No guarding or rebound.  No peritoneal signs. Musculoskeletal: No LE edema.  Neurologic:  A&Ox3.  Speech is clear.  Face and smile are symmetric.  EOMI.  PERRLA. No horizontal or vertical nystagmus. 5 out of 5 grip, triceps, biceps, hip flexor, dorsiflexion and plantarflexion strength. Normal gait without ataxia. Skin:  Skin is warm, dry and intact. No rash noted. Psychiatric: Mood and affect are normal. Speech and behavior are normal.  Normal judgement.  ____________________________________________   LABS (all labs ordered are listed, but only abnormal results are displayed)  Labs Reviewed  SEDIMENTATION RATE - Abnormal; Notable for the following:       Result Value   Sed Rate 22 (*)    All other components within normal limits   ____________________________________________  EKG  Not indicated ____________________________________________  RADIOLOGY  No results found.  ____________________________________________   PROCEDURES  Procedure(s) performed: None  Procedures  Critical Care performed: No ____________________________________________   INITIAL IMPRESSION / ASSESSMENT AND PLAN / ED COURSE  Pertinent labs & imaging results that were available during my care of the patient were reviewed by me and considered in my  medical decision making (see chart for details).  51 y.o. female with a history of migraines presenting with right scalp, right facial pain, severe headache, and pain with blurred vision. Overall, the patient did have reassuring imaging during her last hospitalization. Cluster headaches are possible, migraine is also possible. I would also consider trigeminal neuralgia, as well as temporal arteritis. We'll get a sedimentation rate, and if it is negative, I will start the patient on carbamazepine. An isolated In the meantime, we'll treat her symptomatically in the emergency department and reevaluate her for final disposition. She will need both ophthalmology and neurology follow-up.  ----------------------------------------- 3:39 PM on 09/10/2016 -----------------------------------------  At this time, the patient states that she is feeling much better. Her sedimentation rate is 22, so temporal arteritis is very unlikely and steroids are not indicated at this time. Return precautions as well as follow-up instructions were discussed. ____________________________________________  FINAL CLINICAL IMPRESSION(S) / ED DIAGNOSES  Final diagnoses:  Right-sided face pain  Scalp pain  Nonintractable headache, unspecified chronicity pattern, unspecified headache type  Non-intractable vomiting with nausea, unspecified vomiting type  Eye pain, right         NEW MEDICATIONS STARTED DURING THIS VISIT:  New Prescriptions   CARBAMAZEPINE (TEGRETOL-XR) 100 MG 12 HR TABLET    Take 1 tablet (100 mg total) by mouth 2 (two) times daily.   PROCHLORPERAZINE (COMPAZINE) 10 MG TABLET  Take 1 tablet (10 mg total) by mouth every 6 (six) hours as needed for nausea or vomiting.      Rockne Menghini, MD 09/10/16 1546

## 2016-09-10 NOTE — ED Notes (Signed)
Pt sleeping on R side on stretcher, side rails up. Warm blankets covering pt.

## 2016-09-10 NOTE — ED Triage Notes (Signed)
Pt presents to ED via [facility] from home for a migraine. Pt states R eye pressure and top of head pain that goes down R side of face. Pt has hx of migraines. Was seen by doc recently and prescribed Imitrex PRN, given 9. Has taken all of them. Last dose at 10am. Pt was given shot of imitrex at doctor last week and denies that helping. Pt states some nausea with migraine. Pt alert and oriented, ambulated to stretcher from EMS stretcher. At doc last week BP was high, was told it was from pain. EMS BP 180/98 but no hx HTN.

## 2016-09-10 NOTE — ED Notes (Signed)
Pt ambulatory to toilet by self.  

## 2016-09-11 ENCOUNTER — Telehealth: Payer: Self-pay | Admitting: Emergency Medicine

## 2016-09-11 NOTE — Telephone Encounter (Signed)
pateint called to ask about carbamezapine causing drowsiness and whether she should go to work today.  I told her she should not work today.  She is off the weekend.  I will have note at front desk for her.  She agrees to call neuro today to make appt. She also asked whether she could take imitrex with the carbamezapine.  I asked her to call her pharmacist to ask that question. She agrees.

## 2016-09-18 ENCOUNTER — Telehealth: Payer: Self-pay | Admitting: Emergency Medicine

## 2016-09-18 NOTE — Telephone Encounter (Signed)
Patient called and says she lost the tegretol.  Per dr Silverio Lay, can call in to be filled again.  calle dpatient and then called med to [facility] [place] [place].

## 2017-10-01 ENCOUNTER — Other Ambulatory Visit: Payer: Self-pay

## 2017-10-01 ENCOUNTER — Emergency Department: Payer: BLUE CROSS/BLUE SHIELD

## 2017-10-01 ENCOUNTER — Encounter: Payer: Self-pay | Admitting: Emergency Medicine

## 2017-10-01 ENCOUNTER — Emergency Department
Admission: EM | Admit: 2017-10-01 | Discharge: 2017-10-02 | Disposition: A | Discharge status: Home / Self Care | Payer: BLUE CROSS/BLUE SHIELD | Attending: Emergency Medicine | Admitting: Emergency Medicine

## 2017-10-01 DIAGNOSIS — F17210 Nicotine dependence, cigarettes, uncomplicated: Secondary | ICD-10-CM | POA: Diagnosis not present

## 2017-10-01 DIAGNOSIS — Z79899 Other long term (current) drug therapy: Secondary | ICD-10-CM | POA: Insufficient documentation

## 2017-10-01 DIAGNOSIS — K852 Alcohol induced acute pancreatitis without necrosis or infection: Secondary | ICD-10-CM | POA: Insufficient documentation

## 2017-10-01 DIAGNOSIS — R079 Chest pain, unspecified: Secondary | ICD-10-CM | POA: Diagnosis present

## 2017-10-01 LAB — CBC
HCT: 44.3 % (ref 35.0–47.0)
Hemoglobin: 15.5 g/dL (ref 12.0–16.0)
MCH: 33.2 pg (ref 26.0–34.0)
MCHC: 35 g/dL (ref 32.0–36.0)
MCV: 95.1 fL (ref 80.0–100.0)
PLATELETS: 236 10*3/uL (ref 150–440)
RBC: 4.66 MIL/uL (ref 3.80–5.20)
RDW: 13.4 % (ref 11.5–14.5)
WBC: 8.4 10*3/uL (ref 3.6–11.0)

## 2017-10-01 MED ORDER — HALOPERIDOL LACTATE 5 MG/ML IJ SOLN
5.0000 mg | Freq: Once | INTRAMUSCULAR | Status: AC
  Administered 2017-10-01: 5 mg via INTRAVENOUS
  Filled 2017-10-01: qty 1

## 2017-10-01 MED ORDER — SODIUM CHLORIDE 0.9 % IV BOLUS (SEPSIS)
1000.0000 mL | Freq: Once | INTRAVENOUS | Status: AC
  Administered 2017-10-01: 1000 mL via INTRAVENOUS

## 2017-10-01 NOTE — ED Provider Notes (Signed)
[facility] Emergency Department Provider Note  ____________________________________________   First MD Initiated Contact with Patient 10/01/17 2317     (approximate)  I have reviewed the triage vital signs and the nursing notes.   HISTORY  Chief Complaint Chest Pain   HPI Christina Avery is a 51 y.o. female who self presents the emergency department with gradual onset upper abdominal burning sensation radiating to her back and lower chest pain that began this morning.  The patient is an episodic drinker and drank heavily last night.  She has had multiple episodes of pancreatitis in the past and this feels identical.  Her symptoms are associated with nausea and vomiting.  Pain is moderate to severe.  It is worse when trying to eat.  It is nonexertional.  No shortness of breath.  No fevers or chills.  Nothing seems to make the pain better in particular.  Past Medical History:  Diagnosis Date  . Migraines     There are no active problems to display for this patient.   Past Surgical History:  Procedure Laterality Date  . CESAREAN SECTION    . CHOLECYSTECTOMY      Prior to Admission medications   Medication Sig Start Date End Date Taking? Authorizing Provider  acetaminophen (TYLENOL) 500 MG tablet Take 500 mg by mouth every 6 (six) hours as needed.    [provider]  CALCIUM PO Take 1 tablet by mouth daily.    [provider]  carbamazepine (TEGRETOL-XR) 100 MG 12 hr tablet Take 1 tablet (100 mg total) by mouth 2 (two) times daily. 09/10/16 09/10/17  Rockne Menghini, MD  carisoprodol (SOMA) 350 MG tablet Take 1 tablet (350 mg total) by mouth 3 (three) times daily as needed for muscle spasms. 12/05/15   Schaevitz, Myra Rude, MD  ferrous sulfate 325 (65 FE) MG tablet Take 325 mg by mouth daily with breakfast.    [provider]  HYDROcodone-acetaminophen (NORCO) 5-325 MG tablet Take 1 tablet by mouth every 6 (six) hours as  needed for up to 7 doses for severe pain. 10/02/17   Merrily Brittle, MD  ibuprofen (ADVIL,MOTRIN) 200 MG tablet Take 200 mg by mouth every 6 (six) hours as needed.    [provider]  Multiple Vitamins-Minerals (MULTIVITAMIN WITH MINERALS) tablet Take 1 tablet by mouth daily.    [provider]  omeprazole (PRILOSEC) 40 MG capsule Take 40 mg by mouth daily.    [provider]  ondansetron (ZOFRAN ODT) 4 MG disintegrating tablet Take 1 tablet (4 mg total) by mouth every 8 (eight) hours as needed for nausea or vomiting. 10/02/17   Merrily Brittle, MD  prochlorperazine (COMPAZINE) 10 MG tablet Take 1 tablet (10 mg total) by mouth every 6 (six) hours as needed for nausea or vomiting. 09/10/16 09/10/17  Rockne Menghini, MD  terbinafine (LAMISIL) 250 MG tablet Take 250 mg by mouth daily.    [provider]    Allergies Patient has no known allergies.  No family history on file.  Social History Social History   Tobacco Use  . Smoking status: Current Every Day Smoker    Packs/day: 0.50  . Smokeless tobacco: Never Used  Substance Use Topics  . Alcohol use: No  . Drug use: No    Review of Systems Constitutional: No fever/chills Eyes: No visual changes. ENT: No sore throat. Cardiovascular: Positive for chest pain. Respiratory: Denies shortness of breath. Gastrointestinal: Positive for abdominal pain.  Positive for nausea, positive  for vomiting.  No diarrhea.  No constipation. Genitourinary: Negative for dysuria. Musculoskeletal: Negative for back pain. Skin: Negative for rash. Neurological: Negative for headaches, focal weakness or numbness.   ____________________________________________   PHYSICAL EXAM:  VITAL SIGNS: ED Triage Vitals  Enc Vitals Group     BP 10/01/17 2306 (!) 150/102     Pulse Rate 10/01/17 2306 78     Resp 10/01/17 2306 (!) 22     Temp 10/01/17 2306 97.7 F (36.5 C)     Temp Source 10/01/17 2306 Oral     SpO2 10/01/17  2306 100 %     Weight 10/01/17 2304 219 lb (99.3 kg)     Height 10/01/17 2304 6\' 3"  (1.905 m)     Head Circumference --      Peak Flow --      Pain Score 10/01/17 2304 8     Pain Loc --      Pain Edu? --      Excl. in GC? --     Constitutional: Alert and oriented x4 appears uncomfortable holding her upper abdomen nontoxic no diaphoresis speaks full clear sentences Eyes: PERRL EOMI. Head: Atraumatic. Nose: No congestion/rhinnorhea. Mouth/Throat: No trismus Neck: No stridor.   Cardiovascular: Normal rate, regular rhythm. Grossly normal heart sounds.  Good peripheral circulation. Respiratory: Normal respiratory effort.  No retractions. Lungs CTAB and moving good air Gastrointestinal: Soft abdomen no peritonitis diffuse upper abdominal tenderness with no rebound or guarding Musculoskeletal: No lower extremity edema   Neurologic:  Normal speech and language. No gross focal neurologic deficits are appreciated. Skin:  Skin is warm, dry and intact. No rash noted. Psychiatric: Mood and affect are normal. Speech and behavior are normal.    ____________________________________________   DIFFERENTIAL includes but not limited to  Pancreatitis, chronic pancreatitis, cholecystitis, acute coronary syndrome, Boerhaave syndrome ____________________________________________   LABS (all labs ordered are listed, but only abnormal results are displayed)  Labs Reviewed  BASIC METABOLIC PANEL - Abnormal; Notable for the following components:      Result Value   CO2 19 (*)    All other components within normal limits  HEPATIC FUNCTION PANEL - Abnormal; Notable for the following components:   Total Protein 8.8 (*)    Total Bilirubin 1.3 (*)    Indirect Bilirubin 1.0 (*)    All other components within normal limits  LIPASE, BLOOD - Abnormal; Notable for the following components:   Lipase 150 (*)    All other components within normal limits  CBC  TROPONIN I    Lab work reviewed by me  consistent with acute pancreatitis __________________________________________  EKG  ED ECG REPORT I, Merrily Brittle, the attending physician, personally viewed and interpreted this ECG.  Date: 10/01/2017 EKG Time:  Rate: 86 Rhythm: normal sinus rhythm QRS Axis: Leftward axis Intervals: normal ST/T Wave abnormalities: normal Narrative Interpretation: no evidence of acute ischemia  ____________________________________________  RADIOLOGY  Chest x-ray reviewed by me with chronic COPD but no acute disease ____________________________________________   PROCEDURES  Procedure(s) performed: no  Procedures  Critical Care performed: no  Observation: no ____________________________________________   INITIAL IMPRESSION / ASSESSMENT AND PLAN / ED COURSE  Pertinent labs & imaging results that were available during my care of the patient were reviewed by me and considered in my medical decision making (see chart for details).  On arrival the patient is uncomfortable appearing with upper abdominal lower chest pain which is consistent with her previous known alcoholic pancreatitis.  Given 5 mg  of haloperidol IV which nearly completely resolved her symptoms along with a liter of fluid.  I observe the patient for several hours here in the emergency department and then gave her a p.o. challenge.  She would prefer to try to go home which I think is entirely reasonable.  She is not clinically dehydrated.  She will be discharged home with a short course of pain medication and Zofran and strict return precautions.  She verbalizes understanding and agreement with the plan.      ____________________________________________   FINAL CLINICAL IMPRESSION(S) / ED DIAGNOSES  Final diagnoses:  Alcohol-induced acute pancreatitis without infection or necrosis      NEW MEDICATIONS STARTED DURING THIS VISIT:  Discharge Medication List as of 10/02/2017  2:43 AM    START taking these medications     Details  HYDROcodone-acetaminophen (NORCO) 5-325 MG tablet Take 1 tablet by mouth every 6 (six) hours as needed for up to 7 doses for severe pain., Starting Sat 10/02/2017, Print    ondansetron (ZOFRAN ODT) 4 MG disintegrating tablet Take 1 tablet (4 mg total) by mouth every 8 (eight) hours as needed for nausea or vomiting., Starting Sat 10/02/2017, Print         Note:  This document was prepared using Dragon voice recognition software and may include unintentional dictation errors.     Merrily Brittle, MD 10/03/17 [phone]

## 2017-10-01 NOTE — ED Notes (Signed)
ED Provider at bedside. 

## 2017-10-01 NOTE — ED Triage Notes (Signed)
Pt arrives POV to triage with c/o chest pain which started this afternoon and has increased in intensity since that time. Pt is no NAD.

## 2017-10-02 LAB — HEPATIC FUNCTION PANEL
ALT: 26 U/L (ref 14–54)
AST: 37 U/L (ref 15–41)
Albumin: 4.3 g/dL (ref 3.5–5.0)
Alkaline Phosphatase: 98 U/L (ref 38–126)
BILIRUBIN DIRECT: 0.3 mg/dL (ref 0.1–0.5)
BILIRUBIN INDIRECT: 1 mg/dL — AB (ref 0.3–0.9)
TOTAL PROTEIN: 8.8 g/dL — AB (ref 6.5–8.1)
Total Bilirubin: 1.3 mg/dL — ABNORMAL HIGH (ref 0.3–1.2)

## 2017-10-02 LAB — BASIC METABOLIC PANEL
Anion gap: 14 (ref 5–15)
BUN: 9 mg/dL (ref 6–20)
CO2: 19 mmol/L — ABNORMAL LOW (ref 22–32)
CREATININE: 0.65 mg/dL (ref 0.44–1.00)
Calcium: 9.5 mg/dL (ref 8.9–10.3)
Chloride: 102 mmol/L (ref 101–111)
GFR calc Af Amer: >60 mL/min (ref >60)
Glucose, Bld: 93 mg/dL (ref 65–99)
POTASSIUM: 4.1 mmol/L (ref 3.5–5.1)
Sodium: 135 mmol/L (ref 135–145)

## 2017-10-02 LAB — LIPASE, BLOOD: Lipase: 150 U/L — ABNORMAL HIGH (ref 11–51)

## 2017-10-02 LAB — TROPONIN I: Troponin I: <0.03 ng/mL (ref <0.03)

## 2017-10-02 MED ORDER — HYDROCODONE-ACETAMINOPHEN 5-325 MG PO TABS: 1.0000 | ORAL_TABLET | Freq: Four times a day (QID) | ORAL | 0 refills | Status: DC | PRN

## 2017-10-02 MED ORDER — ONDANSETRON 4 MG PO TBDP: 4.0000 mg | ORAL_TABLET | Freq: Three times a day (TID) | ORAL | 0 refills | Status: DC | PRN

## 2017-10-02 NOTE — Discharge Instructions (Signed)
Please refrain from drinking alcohol until your pain completely subsides.  Use your pain and nausea medication as needed for severe symptoms and return to the emergency department for any concerns.  It was a pleasure to take care of you today, and thank you for coming to our emergency department.  If you have any questions or concerns before leaving please ask the nurse to grab me and I'm more than happy to go through your aftercare instructions again.  If you were prescribed any opioid pain medication today such as Norco, Vicodin, Percocet, morphine, hydrocodone, or oxycodone please make sure you do not drive when you are taking this medication as it can alter your ability to drive safely.  If you have any concerns once you are home that you are not improving or are in fact getting worse before you can make it to your follow-up appointment, please do not hesitate to call 911 and come back for further evaluation.  Merrily Brittle, MD  Results for orders placed or performed during the hospital encounter of 10/01/17  Basic metabolic panel  Result Value Ref Range   Sodium 135 135 - 145 mmol/L   Potassium 4.1 3.5 - 5.1 mmol/L   Chloride 102 101 - 111 mmol/L   CO2 19 (L) 22 - 32 mmol/L   Glucose, Bld 93 65 - 99 mg/dL   BUN 9 6 - 20 mg/dL   Creatinine, Ser 5.32 0.44 - 1.00 mg/dL   Calcium 9.5 8.9 - 99.2 mg/dL   GFR calc non Af Amer >60 >60 mL/min   GFR calc Af Amer >60 >60 mL/min   Anion gap 14 5 - 15  CBC  Result Value Ref Range   WBC 8.4 3.6 - 11.0 K/uL   RBC 4.66 3.80 - 5.20 MIL/uL   Hemoglobin 15.5 12.0 - 16.0 g/dL   HCT 42.6 83.4 - 19.6 %   MCV 95.1 80.0 - 100.0 fL   MCH 33.2 26.0 - 34.0 pg   MCHC 35.0 32.0 - 36.0 g/dL   RDW 22.2 97.9 - 89.2 %   Platelets 236 150 - 440 K/uL  Troponin I  Result Value Ref Range   Troponin I <0.03 <0.03 ng/mL  Hepatic function panel  Result Value Ref Range   Total Protein 8.8 (H) 6.5 - 8.1 g/dL   Albumin 4.3 3.5 - 5.0 g/dL   AST 37 15 - 41 U/L   ALT 26 14 - 54 U/L   Alkaline Phosphatase 98 38 - 126 U/L   Total Bilirubin 1.3 (H) 0.3 - 1.2 mg/dL   Bilirubin, Direct 0.3 0.1 - 0.5 mg/dL   Indirect Bilirubin 1.0 (H) 0.3 - 0.9 mg/dL  Lipase, blood  Result Value Ref Range   Lipase 150 (H) 11 - 51 U/L   Dg Chest 2 View  Result Date: 10/01/2017 CLINICAL DATA:  Chest pain EXAM: CHEST  2 VIEW COMPARISON:  Report 11/23/1996 FINDINGS: Hyperinflation with bronchitic changes. Linear areas of probable scarring in the mid to lower lungs. No acute consolidation or effusion. Normal cardiomediastinal silhouette. No pneumothorax. IMPRESSION: Bronchitic changes with areas of scarring in the mid to lower lungs. No acute infiltrate or edema. Electronically Signed   By: Jasmine Pang M.D.   On: 10/01/2017 23:24

## 2017-10-31 ENCOUNTER — Emergency Department: Payer: BLUE CROSS/BLUE SHIELD

## 2017-10-31 ENCOUNTER — Emergency Department
Admission: EM | Admit: 2017-10-31 | Discharge: 2017-10-31 | Disposition: A | Discharge status: Home / Self Care | Payer: BLUE CROSS/BLUE SHIELD | Attending: Emergency Medicine | Admitting: Emergency Medicine

## 2017-10-31 ENCOUNTER — Other Ambulatory Visit: Payer: Self-pay

## 2017-10-31 DIAGNOSIS — M25562 Pain in left knee: Secondary | ICD-10-CM

## 2017-10-31 DIAGNOSIS — S9032XA Contusion of left foot, initial encounter: Secondary | ICD-10-CM | POA: Diagnosis not present

## 2017-10-31 DIAGNOSIS — S8002XA Contusion of left knee, initial encounter: Secondary | ICD-10-CM | POA: Insufficient documentation

## 2017-10-31 DIAGNOSIS — Y92009 Unspecified place in unspecified non-institutional (private) residence as the place of occurrence of the external cause: Secondary | ICD-10-CM | POA: Insufficient documentation

## 2017-10-31 DIAGNOSIS — Y999 Unspecified external cause status: Secondary | ICD-10-CM | POA: Insufficient documentation

## 2017-10-31 DIAGNOSIS — S8982XA Other specified injuries of left lower leg, initial encounter: Secondary | ICD-10-CM | POA: Diagnosis present

## 2017-10-31 DIAGNOSIS — Y9389 Activity, other specified: Secondary | ICD-10-CM | POA: Insufficient documentation

## 2017-10-31 DIAGNOSIS — Z79899 Other long term (current) drug therapy: Secondary | ICD-10-CM | POA: Diagnosis not present

## 2017-10-31 DIAGNOSIS — F17200 Nicotine dependence, unspecified, uncomplicated: Secondary | ICD-10-CM | POA: Insufficient documentation

## 2017-10-31 DIAGNOSIS — S8012XA Contusion of left lower leg, initial encounter: Secondary | ICD-10-CM

## 2017-10-31 DIAGNOSIS — S93402A Sprain of unspecified ligament of left ankle, initial encounter: Secondary | ICD-10-CM | POA: Diagnosis not present

## 2017-10-31 MED ORDER — IBUPROFEN 400 MG PO TABS
400.0000 mg | ORAL_TABLET | Freq: Once | ORAL | Status: AC
  Administered 2017-10-31: 400 mg via ORAL
  Filled 2017-10-31: qty 1
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
## 2017-10-31 NOTE — ED Notes (Signed)
Spoke to Dr. Lamont Snowball regarding patient, orders received.

## 2017-10-31 NOTE — ED Notes (Signed)
Pt verbalizes understanding of discharge instructions.

## 2018-09-22 DIAGNOSIS — F1491 Cocaine use, unspecified, in remission: Secondary | ICD-10-CM | POA: Insufficient documentation

## 2018-09-22 LAB — HM HEPATITIS C SCREENING LAB: HM Hepatitis Screen: NEGATIVE

## 2018-09-22 LAB — HM HIV SCREENING LAB: HM HIV Screening: NEGATIVE

## 2019-03-15 ENCOUNTER — Encounter: Payer: Self-pay | Admitting: Emergency Medicine

## 2019-03-15 ENCOUNTER — Other Ambulatory Visit: Payer: Self-pay

## 2019-03-15 DIAGNOSIS — S90211A Contusion of right great toe with damage to nail, initial encounter: Secondary | ICD-10-CM | POA: Insufficient documentation

## 2019-03-15 DIAGNOSIS — F17210 Nicotine dependence, cigarettes, uncomplicated: Secondary | ICD-10-CM | POA: Insufficient documentation

## 2019-03-15 DIAGNOSIS — Y939 Activity, unspecified: Secondary | ICD-10-CM | POA: Insufficient documentation

## 2019-03-15 DIAGNOSIS — Y92003 Bedroom of unspecified non-institutional (private) residence as the place of occurrence of the external cause: Secondary | ICD-10-CM | POA: Insufficient documentation

## 2019-03-15 DIAGNOSIS — W2203XA Walked into furniture, initial encounter: Secondary | ICD-10-CM | POA: Insufficient documentation

## 2019-03-15 DIAGNOSIS — Z79899 Other long term (current) drug therapy: Secondary | ICD-10-CM | POA: Insufficient documentation

## 2019-03-15 DIAGNOSIS — Y999 Unspecified external cause status: Secondary | ICD-10-CM | POA: Insufficient documentation

## 2019-03-15 NOTE — ED Triage Notes (Signed)
Pt arrives with pain to her right great toe from hitting it Sunday. Pt concerned over broken nail.

## 2019-03-16 ENCOUNTER — Emergency Department: Payer: Self-pay

## 2019-03-16 ENCOUNTER — Emergency Department
Admission: EM | Admit: 2019-03-16 | Discharge: 2019-03-16 | Disposition: A | Discharge status: Home / Self Care | Payer: Self-pay | Attending: Emergency Medicine | Admitting: Emergency Medicine

## 2019-03-16 DIAGNOSIS — S91209A Unspecified open wound of unspecified toe(s) with damage to nail, initial encounter: Secondary | ICD-10-CM

## 2019-03-16 DIAGNOSIS — S90111A Contusion of right great toe without damage to nail, initial encounter: Secondary | ICD-10-CM

## 2019-03-16 MED ORDER — OXYCODONE-ACETAMINOPHEN 5-325 MG PO TABS
1.0000 | ORAL_TABLET | Freq: Once | ORAL | Status: AC
  Administered 2019-03-16: 1 via ORAL
  Filled 2019-03-16: qty 1

## 2019-03-16 MED ORDER — LIDOCAINE HCL (PF) 1 % IJ SOLN
10.0000 mL | Freq: Once | INTRAMUSCULAR | Status: AC
  Administered 2019-03-16: 10 mL via INTRADERMAL
  Filled 2019-03-16: qty 10

## 2019-03-16 NOTE — ED Provider Notes (Signed)
[facility] Emergency Department Provider Note  ____________________________________________  Time seen: Approximately 2:23 AM  I have reviewed the triage vital signs and the nursing notes.   HISTORY  Chief Complaint Toe Pain   HPI Christina Avery is a 53 y.o. female who presents for evaluation of right big toe pain.  Patient reports that she stubbed her toe onto the bed a couple days ago.  She broke her nail in half and part of it is dangling from it.  She is complaining of 7 out of 10 throbbing sharp pain that is constant and worse with weightbearing.   Past Medical History:  Diagnosis Date  . Migraines     There are no active problems to display for this patient.   Past Surgical History:  Procedure Laterality Date  . CESAREAN SECTION    . CHOLECYSTECTOMY      Prior to Admission medications   Medication Sig Start Date End Date Taking? Authorizing Provider  acetaminophen (TYLENOL) 500 MG tablet Take 500 mg by mouth every 6 (six) hours as needed.    [provider]  CALCIUM PO Take 1 tablet by mouth daily.    [provider]  carbamazepine (TEGRETOL-XR) 100 MG 12 hr tablet Take 1 tablet (100 mg total) by mouth 2 (two) times daily. 09/10/16 09/10/17  Rockne MenghiniNorman, Anne-Caroline, MD  carisoprodol (SOMA) 350 MG tablet Take 1 tablet (350 mg total) by mouth 3 (three) times daily as needed for muscle spasms. 12/05/15   Schaevitz, Myra Rudeavid Matthew, MD  ferrous sulfate 325 (65 FE) MG tablet Take 325 mg by mouth daily with breakfast.    [provider]  HYDROcodone-acetaminophen (NORCO) 5-325 MG tablet Take 1 tablet by mouth every 6 (six) hours as needed for up to 7 doses for severe pain. 10/02/17   Merrily Brittleifenbark, Neil, MD  ibuprofen (ADVIL,MOTRIN) 200 MG tablet Take 200 mg by mouth every 6 (six) hours as needed.    [provider]  Multiple Vitamins-Minerals (MULTIVITAMIN WITH MINERALS) tablet Take 1 tablet by mouth daily.    [provider]  omeprazole (PRILOSEC) 40 MG capsule Take 40 mg by mouth daily.    [provider]  ondansetron (ZOFRAN ODT) 4 MG disintegrating tablet Take 1 tablet (4 mg total) by mouth every 8 (eight) hours as needed for nausea or vomiting. 10/02/17   Merrily Brittleifenbark, Neil, MD  prochlorperazine (COMPAZINE) 10 MG tablet Take 1 tablet (10 mg total) by mouth every 6 (six) hours as needed for nausea or vomiting. 09/10/16 09/10/17  Rockne MenghiniNorman, Anne-Caroline, MD  terbinafine (LAMISIL) 250 MG tablet Take 250 mg by mouth daily.    [provider]    Allergies Patient has no known allergies.  No family history on file.  Social History Social History   Tobacco Use  . Smoking status: Current Every Day Smoker    Packs/day: 0.50  . Smokeless tobacco: Never Used  Substance Use Topics  . Alcohol use: No  . Drug use: No    Review of Systems  Constitutional: Negative for fever. Cardiovascular: Negative for chest pain. Respiratory: Negative for shortness of breath. Gastrointestinal: Negative for abdominal pain, vomiting or diarrhea. Musculoskeletal: Negative for back pain. + toe pain Skin: Negative for rash. Neurological: Negative for headaches, weakness or numbness. Psych: No SI or HI  ____________________________________________   PHYSICAL EXAM:  VITAL SIGNS: ED Triage Vitals  Enc Vitals Group     BP 03/15/19 2251 (!) 146/101     Pulse Rate 03/15/19  2251 79     Resp 03/15/19 2251 18     Temp 03/15/19 2251 98.2 F (36.8 C)     Temp Source 03/15/19 2251 Oral     SpO2 03/15/19 2251 100 %     Weight 03/15/19 2252 212 lb (96.2 kg)     Height 03/15/19 2252 6' 3.5" (1.918 m)     Head Circumference --      Peak Flow --      Pain Score 03/15/19 2252 10     Pain Loc --      Pain Edu? --      Excl. in [place]? --     Constitutional: Alert and oriented. Well appearing and in no apparent distress. HEENT:      Head: Normocephalic and atraumatic.         Eyes: Conjunctivae are normal.  Sclera is non-icteric.       Mouth/Throat: Mucous membranes are moist.       Neck: Supple with no signs of meningismus. Cardiovascular: Regular rate and rhythm.  Respiratory: Normal respiratory effort.  Musculoskeletal: Swollen R big toe with partially broken nail which is peeled from the nailbed. Neurologic: Normal speech and language. Face is symmetric. Moving all extremities. No gross focal neurologic deficits are appreciated. Skin: Skin is warm, dry and intact. No rash noted. Psychiatric: Mood and affect are normal. Speech and behavior are normal.  ____________________________________________   LABS (all labs ordered are listed, but only abnormal results are displayed)  Labs Reviewed - No data to display ____________________________________________  EKG  none  ____________________________________________  RADIOLOGY  I have personally reviewed the images performed during this visit and I agree with the Radiologist's read.   Interpretation by Radiologist:  Dg Toe Great Right  Result Date: 03/16/2019 CLINICAL DATA:  53 year old female with pain and swelling after blunt trauma. EXAM: RIGHT GREAT TOE COMPARISON:  None. FINDINGS: The right 1st phalanges appear intact. There is mild 1st MTP joint space loss. No acute osseous abnormality identified. There is soft tissue irregularity in the right 1st toe. No soft tissue gas. No radiopaque foreign body identified. IMPRESSION: No acute osseous abnormality identified. Electronically Signed   By: Odessa Fleming M.D.   On: 03/16/2019 02:53      ____________________________________________   PROCEDURES  Procedure(s) performed: yes .Nail Removal  Date/Time: 03/16/2019 2:23 AM Performed by: Nita Sickle, MD Authorized by: Nita Sickle, MD   Consent:    Consent obtained:  Verbal   Consent given by:  Patient   Risks discussed:  Bleeding, incomplete removal, infection, pain and permanent nail deformity   Alternatives discussed:   No treatment Location:    Foot:  R big toe Pre-procedure details:    Skin preparation:  Betadine   Preparation: Patient was prepped and draped in the usual sterile fashion   Anesthesia (see MAR for exact dosages):    Anesthesia method:  Nerve block   Block needle gauge:  25 G   Block anesthetic:  Lidocaine 1% w/o epi   Block injection procedure:  Anatomic landmarks identified and negative aspiration for blood   Block outcome:  Anesthesia achieved Nail Removal:    Nail removed:  Partial   Nail side:  Medial   Nail bed repaired: no     Removed nail replaced and anchored: no   Post-procedure details:    Dressing:  Xeroform gauze   Patient tolerance of procedure:  Tolerated well, no immediate complications   Critical Care performed:  None ____________________________________________  INITIAL IMPRESSION / ASSESSMENT AND PLAN / ED COURSE   53 y.o. female who presents for evaluation of right big toe pain after stubbing her toe onto the bed a few days ago.  X-ray showed no evidence of fracture . Nail was removed per procedure note above. Wound care discussed with patient.  Follow-up and my standard return recommendations were also discussed       As part of my medical decision making, I reviewed the following data within the [place] notes reviewed and incorporated, Radiograph reviewed , Notes from prior ED visits and [place] Controlled Substance Database   Patient was evaluated in Emergency Department today for the symptoms described in the history of present illness. Patient was evaluated in the context of the global COVID-19 pandemic, which necessitated consideration that the patient might be at risk for infection with the SARS-CoV-2 virus that causes COVID-19. Institutional protocols and algorithms that pertain to the evaluation of patients at risk for COVID-19 are in a state of rapid change based on information released by regulatory bodies including the CDC  and federal and state organizations. These policies and algorithms were followed during the patient's care in the ED.   ____________________________________________   FINAL CLINICAL IMPRESSION(S) / ED DIAGNOSES   Final diagnoses:  Contusion of right great toe without damage to nail, initial encounter  Avulsion of toenail, initial encounter      NEW MEDICATIONS STARTED DURING THIS VISIT:  ED Discharge Orders    None       Note:  This document was prepared using Dragon voice recognition software and may include unintentional dictation errors.    Alfred Levins, [place], MD 03/16/19 [phone]

## 2019-03-16 NOTE — ED Notes (Signed)
Toenail partially removed from right great toe. No bleeding noted.

## 2019-03-16 NOTE — ED Notes (Signed)
Right great toe dressed by Dr. Alfred Levins.

## 2019-07-24 DIAGNOSIS — Z87898 Personal history of other specified conditions: Secondary | ICD-10-CM

## 2019-07-24 DIAGNOSIS — F1491 Cocaine use, unspecified, in remission: Secondary | ICD-10-CM

## 2019-07-25 ENCOUNTER — Other Ambulatory Visit: Payer: Self-pay

## 2019-07-25 ENCOUNTER — Encounter: Payer: Self-pay | Admitting: Physician Assistant

## 2019-07-25 ENCOUNTER — Ambulatory Visit: Payer: Self-pay

## 2019-07-25 ENCOUNTER — Ambulatory Visit: Payer: Self-pay | Admitting: Physician Assistant

## 2019-07-25 DIAGNOSIS — Z113 Encounter for screening for infections with a predominantly sexual mode of transmission: Secondary | ICD-10-CM

## 2019-07-25 DIAGNOSIS — A5901 Trichomonal vulvovaginitis: Secondary | ICD-10-CM

## 2019-07-25 LAB — WET PREP FOR TRICH, YEAST, CLUE
Trichomonas Exam: POSITIVE — AB
Yeast Exam: NEGATIVE

## 2019-07-25 MED ORDER — METRONIDAZOLE 500 MG PO TABS: 2000.0000 mg | ORAL_TABLET | Freq: Once | ORAL | 0 refills | Status: AC

## 2019-07-25 NOTE — Progress Notes (Signed)
Wet mount reviewed and pt treated for Trich per standing order and per provider order. Provider orders completed.Brisha Mccabe, RN 

## 2019-07-25 NOTE — Progress Notes (Signed)
[facility] Department STI clinic/screening visit  Subjective:  Christina Avery is a 53 y.o. female being seen today for an STI screening visit. The patient reports they do have symptoms.  Patient reports that they do not desire a pregnancy in the next year.   They reported they are not interested in discussing contraception today.  No LMP recorded (approximate). Patient is postmenopausal.   Patient has the following medical conditions:   Patient Active Problem List   Diagnosis Date Noted  . History of cocaine use 09/22/2018    Chief Complaint  Patient presents with  . SEXUALLY TRANSMITTED DISEASE    HPI  Patient reports that she is having a yellow, clumpy discharge with internal and external itching since 07/22/2019.  Reports that she also had a little blood when she wiped on Saturday.  Last period was 2008/postmenopausal.  See flowsheet for further details and programmatic requirements.    The following portions of the patient's history were reviewed and updated as appropriate: allergies, current medications, past medical history, past social history, past surgical history and problem list.  Objective:  There were no vitals filed for this visit.  Physical Exam Constitutional:      General: She is not in acute distress.    Appearance: Normal appearance. She is normal weight.  HENT:     Head: Normocephalic and atraumatic.     Comments: No nits, lice, or hair loss. No cervical, supraclavicular, or axillary adenopathy.    Mouth/Throat:     Mouth: Mucous membranes are moist.     Pharynx: Oropharynx is clear. No oropharyngeal exudate or posterior oropharyngeal erythema.  Eyes:     Conjunctiva/sclera: Conjunctivae normal.  Pulmonary:     Effort: Pulmonary effort is normal.  Abdominal:     Palpations: Abdomen is soft. There is no mass.     Tenderness: There is no abdominal tenderness. There is no guarding or rebound.  Genitourinary:    General: Normal vulva.      Rectum: Normal.     Comments: External genitalia/pubic area without nits, lice, edema, erythema, lesions and inguinal adenopathy. Vagina with normal  Mucosa and small amount of thin, yellowish discharge, pH=>4.5. Cervix without visible lesions, friable to q-tip. Uterus firm, mobile, nt, no masses, no CMT, no adnexal tenderness or fullness. Musculoskeletal:     Cervical back: Neck supple. No tenderness.  Skin:    General: Skin is warm and dry.     Findings: No bruising, erythema, lesion or rash.  Neurological:     Mental Status: She is alert and oriented to person, place, and time.  Psychiatric:        Mood and Affect: Mood normal.        Behavior: Behavior normal.        Thought Content: Thought content normal.        Judgment: Judgment normal.      Assessment and Plan:  Christina Avery is a 53 y.o. female presenting to the [facility] Department for STI screening  1. Screening for STD (sexually transmitted disease) Patient into clinic with symptoms.  Rec condoms with all sex. Await test results.  Counseled that RN will call if needs to RTC for further treatment once results are back.  - WET PREP FOR TRICH, YEAST, CLUE - Chlamydia/Gonorrhea [place] [facility] - HIV [place] [facility] - Syphilis Serology, [place] [facility]  2. Vaginal trichomoniasis Will treat for Trich with Metronidazole 2 g po with food, no EtOH for  24 hr before and until 72 hr after taking medicine. No sex for 7 days and until after partner completes treatment. RTC for re-treatment if vomits < 2 hr after taking medicine. - metroNIDAZOLE (FLAGYL) 500 MG tablet; Take 4 tablets (2,000 mg total) by mouth once for 1 dose.  Dispense: 4 tablet; Refill: 0     No follow-ups on file.  No future appointments.  Jerene Dilling, PA

## 2019-09-05 ENCOUNTER — Emergency Department
Admission: EM | Admit: 2019-09-05 | Discharge: 2019-09-05 | Disposition: A | Discharge status: Home / Self Care | Payer: Self-pay | Attending: Emergency Medicine | Admitting: Emergency Medicine

## 2019-09-05 ENCOUNTER — Emergency Department: Payer: Self-pay

## 2019-09-05 ENCOUNTER — Encounter: Payer: Self-pay | Admitting: Intensive Care

## 2019-09-05 ENCOUNTER — Other Ambulatory Visit: Payer: Self-pay

## 2019-09-05 DIAGNOSIS — F17210 Nicotine dependence, cigarettes, uncomplicated: Secondary | ICD-10-CM | POA: Insufficient documentation

## 2019-09-05 DIAGNOSIS — R109 Unspecified abdominal pain: Secondary | ICD-10-CM | POA: Insufficient documentation

## 2019-09-05 LAB — CBC
HCT: 40 % (ref 36.0–46.0)
Hemoglobin: 13.5 g/dL (ref 12.0–15.0)
MCH: 32 pg (ref 26.0–34.0)
MCHC: 33.8 g/dL (ref 30.0–36.0)
MCV: 94.8 fL (ref 80.0–100.0)
Platelets: 228 10*3/uL (ref 150–400)
RBC: 4.22 MIL/uL (ref 3.87–5.11)
RDW: 11.9 % (ref 11.5–15.5)
WBC: 4.2 10*3/uL (ref 4.0–10.5)
nRBC: 0 % (ref 0.0–0.2)

## 2019-09-05 LAB — HEPATIC FUNCTION PANEL
ALT: 381 U/L — ABNORMAL HIGH (ref 0–44)
AST: 239 U/L — ABNORMAL HIGH (ref 15–41)
Albumin: 3.9 g/dL (ref 3.5–5.0)
Alkaline Phosphatase: 241 U/L — ABNORMAL HIGH (ref 38–126)
Bilirubin, Direct: 2.5 mg/dL — ABNORMAL HIGH (ref 0.0–0.2)
Indirect Bilirubin: 2.2 mg/dL — ABNORMAL HIGH (ref 0.3–0.9)
Total Bilirubin: 4.7 mg/dL — ABNORMAL HIGH (ref 0.3–1.2)
Total Protein: 7.8 g/dL (ref 6.5–8.1)

## 2019-09-05 LAB — BASIC METABOLIC PANEL
Anion gap: 11 (ref 5–15)
BUN: 12 mg/dL (ref 6–20)
CO2: 21 mmol/L — ABNORMAL LOW (ref 22–32)
Calcium: 9.1 mg/dL (ref 8.9–10.3)
Chloride: 107 mmol/L (ref 98–111)
Creatinine, Ser: 0.85 mg/dL (ref 0.44–1.00)
GFR calc Af Amer: >60 mL/min (ref >60)
GFR calc non Af Amer: >60 mL/min (ref >60)
Glucose, Bld: 116 mg/dL — ABNORMAL HIGH (ref 70–99)
Potassium: 3.5 mmol/L (ref 3.5–5.1)
Sodium: 139 mmol/L (ref 135–145)

## 2019-09-05 LAB — LIPASE, BLOOD: Lipase: 34 U/L (ref 11–51)

## 2019-09-05 LAB — TROPONIN I (HIGH SENSITIVITY)
Troponin I (High Sensitivity): 3 ng/L (ref <18)
Troponin I (High Sensitivity): 4 ng/L (ref <18)

## 2019-09-05 MED ORDER — ONDANSETRON 4 MG PO TBDP: 4.0000 mg | ORAL_TABLET | Freq: Three times a day (TID) | ORAL | 0 refills | Status: DC | PRN

## 2019-09-05 MED ORDER — SODIUM CHLORIDE 0.9 % IV BOLUS
1000.0000 mL | Freq: Once | INTRAVENOUS | Status: AC
  Administered 2019-09-05: 17:00:00 1000 mL via INTRAVENOUS

## 2019-09-05 MED ORDER — ONDANSETRON HCL 4 MG/2ML IJ SOLN
4.0000 mg | Freq: Once | INTRAMUSCULAR | Status: AC
  Administered 2019-09-05: 4 mg via INTRAVENOUS
  Filled 2019-09-05: qty 2

## 2019-09-05 MED ORDER — FENTANYL CITRATE (PF) 100 MCG/2ML IJ SOLN
100.0000 ug | Freq: Once | INTRAMUSCULAR | Status: AC
  Administered 2019-09-05: 17:00:00 100 ug via INTRAVENOUS
  Filled 2019-09-05: qty 2

## 2019-09-05 NOTE — ED Notes (Signed)
States pain has improved.

## 2019-09-05 NOTE — Discharge Instructions (Addendum)
Please take your nausea medication as needed.  Please obtain plenty of rest and drink plenty of fluids.  Please follow-up with your doctor within the next 2 days for recheck/reevaluation as well as recheck of your liver function test.  Return to the emergency department for any worsening pain, fever or any other symptom personally concerning to yourself.

## 2019-09-05 NOTE — ED Triage Notes (Signed)
Pt comes into the ED via EMS from home with c/o left sided chest pain, tender on palpation, started last night, b/p 166/100,CBG 107..having a lot of belching.

## 2019-09-05 NOTE — ED Triage Notes (Signed)
C/o right sided dull chest pain under breast  That started last night. Tender with palpation. C/o belching

## 2019-09-05 NOTE — ED Notes (Signed)
Pt signed esignature.  Discharge inst to pt.  Iv dc'ed.  Pt alert.   

## 2019-09-05 NOTE — ED Provider Notes (Signed)
[facility] Emergency Department Provider Note  Time seen: 4:48 PM  I have reviewed the triage vital signs and the nursing notes.   HISTORY  Chief Complaint Chest Pain   HPI Christina Avery is a 54 y.o. female with a past medical history of migraines presents to the emergency department for right flank pain.  According to the patient since yesterday she has been experiencing pain in her right flank.  Patient thought she could be constipated so she took magnesium citrate and a suppository last night had loose stool overnight but no relief of the abdominal pain.  States it is occurring intermittently it is sharp and severe on the right flank when it does occur.  Patient denies any dysuria or hematuria.  No history of kidney stones.  Patient states nausea and vomiting just starting hours ago.  Denies any fever cough or shortness of breath.   Past Medical History:  Diagnosis Date  . Migraines     Patient Active Problem List   Diagnosis Date Noted  . History of cocaine use 09/22/2018    Past Surgical History:  Procedure Laterality Date  . CESAREAN SECTION    . CHOLECYSTECTOMY      Prior to Admission medications   Medication Sig Start Date End Date Taking? Authorizing Provider  acetaminophen (TYLENOL) 500 MG tablet Take 500 mg by mouth every 6 (six) hours as needed.    [provider]  CALCIUM PO Take 1 tablet by mouth daily.    [provider]  carbamazepine (TEGRETOL-XR) 100 MG 12 hr tablet Take 1 tablet (100 mg total) by mouth 2 (two) times daily. 09/10/16 09/10/17  Rockne Menghini, MD  carisoprodol (SOMA) 350 MG tablet Take 1 tablet (350 mg total) by mouth 3 (three) times daily as needed for muscle spasms. Patient not taking: Reported on 07/25/2019 12/05/15   Myrna Blazer, MD  ferrous sulfate 325 (65 FE) MG tablet Take 325 mg by mouth daily with breakfast.    [provider]  HYDROcodone-acetaminophen (NORCO) 5-325 MG  tablet Take 1 tablet by mouth every 6 (six) hours as needed for up to 7 doses for severe pain. Patient not taking: Reported on 07/25/2019 10/02/17   Merrily Brittle, MD  ibuprofen (ADVIL,MOTRIN) 200 MG tablet Take 200 mg by mouth every 6 (six) hours as needed.    [provider]  Multiple Vitamins-Minerals (MULTIVITAMIN WITH MINERALS) tablet Take 1 tablet by mouth daily.    [provider]  omeprazole (PRILOSEC) 40 MG capsule Take 40 mg by mouth daily.    [provider]  ondansetron (ZOFRAN ODT) 4 MG disintegrating tablet Take 1 tablet (4 mg total) by mouth every 8 (eight) hours as needed for nausea or vomiting. Patient not taking: Reported on 07/25/2019 10/02/17   Merrily Brittle, MD  prochlorperazine (COMPAZINE) 10 MG tablet Take 1 tablet (10 mg total) by mouth every 6 (six) hours as needed for nausea or vomiting. 09/10/16 09/10/17  Rockne Menghini, MD  terbinafine (LAMISIL) 250 MG tablet Take 250 mg by mouth daily.    [provider]    No Known Allergies  History reviewed. No pertinent family history.  Social History Social History   Tobacco Use  . Smoking status: Current Every Day Smoker    Packs/day: 0.50    Types: Cigarettes  . Smokeless tobacco: Never Used  Substance Use Topics  . Alcohol use: Yes    Comment: occ  . Drug use: Yes    Types:  Marijuana    Review of Systems Constitutional: Negative for fever. Cardiovascular: Negative for chest pain. Respiratory: Negative for shortness of breath. Gastrointestinal: Right flank pain.  Positive for nausea vomiting. Genitourinary: Negative for urinary compaints Musculoskeletal: Negative for musculoskeletal complaints Neurological: Negative for headache All other ROS negative  ____________________________________________   PHYSICAL EXAM:  VITAL SIGNS: ED Triage Vitals  Enc Vitals Group     BP 09/05/19 1520 (!) 128/98     Pulse Rate 09/05/19 1520 86     Resp 09/05/19 1520 16     Temp  09/05/19 1520 98.5 F (36.9 C)     Temp Source 09/05/19 1520 Oral     SpO2 09/05/19 1520 98 %     Weight 09/05/19 1516 200 lb (90.7 kg)     Height 09/05/19 1516 6' 3.5" (1.918 m)     Head Circumference --      Peak Flow --      Pain Score 09/05/19 1516 10     Pain Loc --      Pain Edu? --      Excl. in [place]? --    Constitutional: Alert and oriented.  Mild distress due to nausea/pain. Eyes: Normal exam ENT      Head: Normocephalic and atraumatic.      Mouth/Throat: Mucous membranes are moist. Cardiovascular: Normal rate, regular rhythm. Respiratory: Normal respiratory effort without tachypnea nor retractions. Breath sounds are clear Gastrointestinal: Soft mild right upper mid and lower abdominal tenderness palpation. Musculoskeletal: Nontender with normal range of motion in all extremities. Neurologic:  Normal speech and language. No gross focal neurologic deficits Skin:  Skin is warm, dry and intact.  Psychiatric: Mood and affect are normal.   ____________________________________________    EKG  EKG viewed and interpreted by myself shows a normal sinus rhythm at 93 bpm with a narrow QRS, normal axis, normal intervals besides very slight QT prolongation with nonspecific ST changes.  ____________________________________________    RADIOLOGY  Chest x-ray negative  ____________________________________________   INITIAL IMPRESSION / ASSESSMENT AND PLAN / ED COURSE  Pertinent labs & imaging results that were available during my care of the patient were reviewed by me and considered in my medical decision making (see chart for details).   Patient presents emergency department for right flank pain since yesterday.  Differential would include ureterolithiasis, UTI pyelonephritis, patient is status post cholecystectomy, I have added on hepatic function panel and lipase to the patient's lab work.  Reassuringly her labs are thus far normal including a negative troponin.  We will  treat the patient's pain, nausea and IV hydrate while awaiting CT results in the emergency department.  Patient agreeable to plan of care.  Patient's exam shows elevated LFTs.  Patient is status post cholecystectomy many years ago per patient.  Patient denies any alcohol use.  Denies any Tylenol use.  States rarely she will take an ibuprofen for discomfort.  Given the patient has nausea vomiting and elevated LFTs this could possibly indicate a viral illness.  Patient states she is feeling much better after fluids and nausea medication as well as pain medication.  CT is largely nonrevealing.  I believe the patient would be safe for discharge home with PCP follow-up within the next 48 hours for recheck.  I discussed this with the patient we will discharge with nausea medication, instructed her to drink plenty of fluids and to return for any fever worsening/return of discomfort  Christina Avery was evaluated in Emergency Department on 09/05/2019  for the symptoms described in the history of present illness. She was evaluated in the context of the global COVID-19 pandemic, which necessitated consideration that the patient might be at risk for infection with the SARS-CoV-2 virus that causes COVID-19. Institutional protocols and algorithms that pertain to the evaluation of patients at risk for COVID-19 are in a state of rapid change based on information released by regulatory bodies including the CDC and federal and state organizations. These policies and algorithms were followed during the patient's care in the ED.  ____________________________________________   FINAL CLINICAL IMPRESSION(S) / ED DIAGNOSES  Right flank pain   Minna Antis, MD 09/05/19 [phone]

## 2019-09-10 DIAGNOSIS — R7401 Elevation of levels of liver transaminase levels: Secondary | ICD-10-CM | POA: Insufficient documentation

## 2020-01-12 ENCOUNTER — Other Ambulatory Visit: Payer: Self-pay

## 2020-01-12 ENCOUNTER — Ambulatory Visit (LOCAL_COMMUNITY_HEALTH_CENTER): Payer: Self-pay

## 2020-01-12 DIAGNOSIS — Z111 Encounter for screening for respiratory tuberculosis: Secondary | ICD-10-CM

## 2020-01-15 ENCOUNTER — Other Ambulatory Visit: Payer: Self-pay

## 2020-01-15 ENCOUNTER — Ambulatory Visit (LOCAL_COMMUNITY_HEALTH_CENTER): Payer: Self-pay

## 2020-01-15 ENCOUNTER — Telehealth: Payer: Self-pay

## 2020-01-15 DIAGNOSIS — Z111 Encounter for screening for respiratory tuberculosis: Secondary | ICD-10-CM

## 2020-01-15 LAB — TB SKIN TEST
Induration: 0 mm
TB Skin Test: NEGATIVE

## 2020-01-15 NOTE — Telephone Encounter (Signed)
DNKA for PPDR at 4:25 pm today in Nurse Clinic. Call to client who states she forgot and is on the way to work. Client states she will try to be here today by 5 pm. Jossie Ng, RN

## 2020-06-15 ENCOUNTER — Emergency Department: Payer: Self-pay

## 2020-06-15 ENCOUNTER — Observation Stay: Payer: Self-pay

## 2020-06-15 ENCOUNTER — Inpatient Hospital Stay
Admission: EM | Admit: 2020-06-15 | Discharge: 2020-06-18 | DRG: 312 | Disposition: A | Discharge status: Home / Self Care | Payer: Self-pay | Attending: Internal Medicine | Admitting: Internal Medicine

## 2020-06-15 ENCOUNTER — Other Ambulatory Visit: Payer: Self-pay

## 2020-06-15 DIAGNOSIS — R079 Chest pain, unspecified: Secondary | ICD-10-CM | POA: Diagnosis present

## 2020-06-15 DIAGNOSIS — R55 Syncope and collapse: Principal | ICD-10-CM

## 2020-06-15 DIAGNOSIS — I428 Other cardiomyopathies: Secondary | ICD-10-CM | POA: Diagnosis present

## 2020-06-15 DIAGNOSIS — Z8673 Personal history of transient ischemic attack (TIA), and cerebral infarction without residual deficits: Secondary | ICD-10-CM

## 2020-06-15 DIAGNOSIS — Z79899 Other long term (current) drug therapy: Secondary | ICD-10-CM

## 2020-06-15 DIAGNOSIS — F17210 Nicotine dependence, cigarettes, uncomplicated: Secondary | ICD-10-CM | POA: Diagnosis present

## 2020-06-15 DIAGNOSIS — G8929 Other chronic pain: Secondary | ICD-10-CM | POA: Diagnosis present

## 2020-06-15 DIAGNOSIS — I5022 Chronic systolic (congestive) heart failure: Secondary | ICD-10-CM | POA: Diagnosis present

## 2020-06-15 DIAGNOSIS — G43909 Migraine, unspecified, not intractable, without status migrainosus: Secondary | ICD-10-CM | POA: Diagnosis present

## 2020-06-15 DIAGNOSIS — F17200 Nicotine dependence, unspecified, uncomplicated: Secondary | ICD-10-CM | POA: Diagnosis present

## 2020-06-15 DIAGNOSIS — E785 Hyperlipidemia, unspecified: Secondary | ICD-10-CM | POA: Diagnosis present

## 2020-06-15 DIAGNOSIS — W19XXXA Unspecified fall, initial encounter: Secondary | ICD-10-CM | POA: Diagnosis present

## 2020-06-15 DIAGNOSIS — I340 Nonrheumatic mitral (valve) insufficiency: Secondary | ICD-10-CM | POA: Diagnosis present

## 2020-06-15 DIAGNOSIS — R9431 Abnormal electrocardiogram [ECG] [EKG]: Secondary | ICD-10-CM

## 2020-06-15 DIAGNOSIS — Z8249 Family history of ischemic heart disease and other diseases of the circulatory system: Secondary | ICD-10-CM

## 2020-06-15 DIAGNOSIS — I2510 Atherosclerotic heart disease of native coronary artery without angina pectoris: Secondary | ICD-10-CM | POA: Diagnosis present

## 2020-06-15 DIAGNOSIS — I493 Ventricular premature depolarization: Secondary | ICD-10-CM | POA: Diagnosis present

## 2020-06-15 DIAGNOSIS — R296 Repeated falls: Secondary | ICD-10-CM | POA: Diagnosis present

## 2020-06-15 DIAGNOSIS — F1491 Cocaine use, unspecified, in remission: Secondary | ICD-10-CM

## 2020-06-15 DIAGNOSIS — I110 Hypertensive heart disease with heart failure: Secondary | ICD-10-CM | POA: Diagnosis present

## 2020-06-15 DIAGNOSIS — F1210 Cannabis abuse, uncomplicated: Secondary | ICD-10-CM | POA: Diagnosis present

## 2020-06-15 DIAGNOSIS — S0003XA Contusion of scalp, initial encounter: Secondary | ICD-10-CM

## 2020-06-15 DIAGNOSIS — I272 Pulmonary hypertension, unspecified: Secondary | ICD-10-CM | POA: Diagnosis present

## 2020-06-15 DIAGNOSIS — Z23 Encounter for immunization: Secondary | ICD-10-CM

## 2020-06-15 DIAGNOSIS — M62838 Other muscle spasm: Secondary | ICD-10-CM | POA: Diagnosis present

## 2020-06-15 DIAGNOSIS — E042 Nontoxic multinodular goiter: Secondary | ICD-10-CM

## 2020-06-15 DIAGNOSIS — I739 Peripheral vascular disease, unspecified: Secondary | ICD-10-CM | POA: Diagnosis present

## 2020-06-15 DIAGNOSIS — F1010 Alcohol abuse, uncomplicated: Secondary | ICD-10-CM | POA: Diagnosis present

## 2020-06-15 DIAGNOSIS — E876 Hypokalemia: Secondary | ICD-10-CM | POA: Diagnosis present

## 2020-06-15 DIAGNOSIS — I426 Alcoholic cardiomyopathy: Secondary | ICD-10-CM | POA: Diagnosis present

## 2020-06-15 DIAGNOSIS — Z20822 Contact with and (suspected) exposure to covid-19: Secondary | ICD-10-CM | POA: Diagnosis present

## 2020-06-15 DIAGNOSIS — F1410 Cocaine abuse, uncomplicated: Secondary | ICD-10-CM | POA: Diagnosis present

## 2020-06-15 DIAGNOSIS — Z87898 Personal history of other specified conditions: Secondary | ICD-10-CM

## 2020-06-15 LAB — HEPATIC FUNCTION PANEL
ALT: 19 U/L (ref 0–44)
AST: 29 U/L (ref 15–41)
Albumin: 3.8 g/dL (ref 3.5–5.0)
Alkaline Phosphatase: 81 U/L (ref 38–126)
Bilirubin, Direct: 0.3 mg/dL — ABNORMAL HIGH (ref 0.0–0.2)
Indirect Bilirubin: 1.2 mg/dL — ABNORMAL HIGH (ref 0.3–0.9)
Total Bilirubin: 1.5 mg/dL — ABNORMAL HIGH (ref 0.3–1.2)
Total Protein: 7.5 g/dL (ref 6.5–8.1)

## 2020-06-15 LAB — CBC
HCT: 41.7 % (ref 36.0–46.0)
Hemoglobin: 14 g/dL (ref 12.0–15.0)
MCH: 33.1 pg (ref 26.0–34.0)
MCHC: 33.6 g/dL (ref 30.0–36.0)
MCV: 98.6 fL (ref 80.0–100.0)
Platelets: 219 10*3/uL (ref 150–400)
RBC: 4.23 MIL/uL (ref 3.87–5.11)
RDW: 11.6 % (ref 11.5–15.5)
WBC: 5.3 10*3/uL (ref 4.0–10.5)
nRBC: 0 % (ref 0.0–0.2)

## 2020-06-15 LAB — BASIC METABOLIC PANEL
Anion gap: 12 (ref 5–15)
BUN: 14 mg/dL (ref 6–20)
CO2: 19 mmol/L — ABNORMAL LOW (ref 22–32)
Calcium: 9.1 mg/dL (ref 8.9–10.3)
Chloride: 105 mmol/L (ref 98–111)
Creatinine, Ser: 0.91 mg/dL (ref 0.44–1.00)
GFR, Estimated: >60 mL/min (ref >60)
Glucose, Bld: 105 mg/dL — ABNORMAL HIGH (ref 70–99)
Potassium: 3.5 mmol/L (ref 3.5–5.1)
Sodium: 136 mmol/L (ref 135–145)

## 2020-06-15 LAB — RESPIRATORY PANEL BY RT PCR (FLU A&B, COVID)
Influenza A by PCR: NEGATIVE
Influenza B by PCR: NEGATIVE
SARS Coronavirus 2 by RT PCR: NEGATIVE

## 2020-06-15 LAB — ETHANOL: Alcohol, Ethyl (B): <10 mg/dL (ref <10)

## 2020-06-15 LAB — URINE DRUG SCREEN, QUALITATIVE (ARMC ONLY)
Amphetamines, Ur Screen: NOT DETECTED
Barbiturates, Ur Screen: NOT DETECTED
Benzodiazepine, Ur Scrn: NOT DETECTED
Cannabinoid 50 Ng, Ur ~~LOC~~: POSITIVE — AB
Cocaine Metabolite,Ur ~~LOC~~: POSITIVE — AB
MDMA (Ecstasy)Ur Screen: NOT DETECTED
Methadone Scn, Ur: NOT DETECTED
Opiate, Ur Screen: NOT DETECTED
Phencyclidine (PCP) Ur S: NOT DETECTED
Tricyclic, Ur Screen: NOT DETECTED

## 2020-06-15 LAB — RETICULOCYTES
Immature Retic Fract: 11.2 % (ref 2.3–15.9)
RBC.: 4.39 MIL/uL (ref 3.87–5.11)
Retic Count, Absolute: 86.9 10*3/uL (ref 19.0–186.0)
Retic Ct Pct: 2 % (ref 0.4–3.1)

## 2020-06-15 LAB — MAGNESIUM: Magnesium: 1.9 mg/dL (ref 1.7–2.4)

## 2020-06-15 LAB — LACTIC ACID, PLASMA: Lactic Acid, Venous: 1.6 mmol/L (ref 0.5–1.9)

## 2020-06-15 LAB — TROPONIN I (HIGH SENSITIVITY)
Troponin I (High Sensitivity): 6 ng/L (ref <18)
Troponin I (High Sensitivity): 9 ng/L (ref <18)

## 2020-06-15 MED ORDER — SODIUM CHLORIDE 0.9 % IV BOLUS
1000.0000 mL | Freq: Once | INTRAVENOUS | Status: AC
  Administered 2020-06-15: 1000 mL via INTRAVENOUS

## 2020-06-15 MED ORDER — INFLUENZA VAC SPLIT QUAD 0.5 ML IM SUSY
0.5000 mL | PREFILLED_SYRINGE | INTRAMUSCULAR | Status: AC
  Administered 2020-06-17: 0.5 mL via INTRAMUSCULAR
  Filled 2020-06-15 (×2): qty 0.5

## 2020-06-15 MED ORDER — MAGNESIUM OXIDE 400 (241.3 MG) MG PO TABS
400.0000 mg | ORAL_TABLET | Freq: Every day | ORAL | Status: AC
  Administered 2020-06-15 – 2020-06-17 (×3): 400 mg via ORAL
  Filled 2020-06-15 (×4): qty 1

## 2020-06-15 MED ORDER — CALCIUM CARBONATE ANTACID 500 MG PO CHEW
1.0000 | CHEWABLE_TABLET | Freq: Three times a day (TID) | ORAL | Status: AC
  Administered 2020-06-16 – 2020-06-17 (×5): 200 mg via ORAL
  Filled 2020-06-15 (×5): qty 1

## 2020-06-15 MED ORDER — POTASSIUM CHLORIDE CRYS ER 20 MEQ PO TBCR
20.0000 meq | EXTENDED_RELEASE_TABLET | Freq: Once | ORAL | Status: AC
  Administered 2020-06-15: 20 meq via ORAL
  Filled 2020-06-15: qty 1

## 2020-06-15 MED ORDER — THIAMINE HCL 100 MG PO TABS
100.0000 mg | ORAL_TABLET | Freq: Every day | ORAL | Status: DC
  Administered 2020-06-15 – 2020-06-18 (×4): 100 mg via ORAL
  Filled 2020-06-15 (×4): qty 1

## 2020-06-15 MED ORDER — STROKE: EARLY STAGES OF RECOVERY BOOK: Freq: Once | Status: DC

## 2020-06-15 MED ORDER — IOHEXOL 350 MG/ML SOLN
75.0000 mL | Freq: Once | INTRAVENOUS | Status: AC | PRN
  Administered 2020-06-15: 75 mL via INTRAVENOUS

## 2020-06-15 MED ORDER — ACETAMINOPHEN 325 MG PO TABS
650.0000 mg | ORAL_TABLET | ORAL | Status: DC | PRN
  Administered 2020-06-15 – 2020-06-17 (×7): 650 mg via ORAL
  Filled 2020-06-15 (×7): qty 2

## 2020-06-15 MED ORDER — MAGNESIUM SULFATE IN D5W 1-5 GM/100ML-% IV SOLN: 1.0000 g | Freq: Once | INTRAVENOUS | Status: DC

## 2020-06-15 MED ORDER — IBUPROFEN 600 MG PO TABS
600.0000 mg | ORAL_TABLET | ORAL | Status: AC
  Administered 2020-06-15: 600 mg via ORAL
  Filled 2020-06-15: qty 1

## 2020-06-15 MED ORDER — HYDROCODONE-ACETAMINOPHEN 5-325 MG PO TABS
1.0000 | ORAL_TABLET | Freq: Once | ORAL | Status: AC
  Administered 2020-06-15: 1 via ORAL
  Filled 2020-06-15: qty 1

## 2020-06-15 MED ORDER — HEPARIN SODIUM (PORCINE) 5000 UNIT/ML IJ SOLN
5000.0000 [IU] | Freq: Three times a day (TID) | INTRAMUSCULAR | Status: DC
  Administered 2020-06-15 – 2020-06-17 (×6): 5000 [IU] via SUBCUTANEOUS
  Filled 2020-06-15 (×7): qty 1

## 2020-06-15 MED ORDER — ACETAMINOPHEN 160 MG/5ML PO SOLN
650.0000 mg | ORAL | Status: DC | PRN
  Filled 2020-06-15: qty 20.3

## 2020-06-15 MED ORDER — ACETAMINOPHEN 650 MG RE SUPP: 650.0000 mg | RECTAL | Status: DC | PRN

## 2020-06-15 MED ORDER — PANTOPRAZOLE SODIUM 40 MG IV SOLR: 40.0000 mg | INTRAVENOUS | Status: DC

## 2020-06-15 MED ORDER — SUCRALFATE 1 GM/10ML PO SUSP
1.0000 g | Freq: Three times a day (TID) | ORAL | Status: DC
  Administered 2020-06-15 – 2020-06-17 (×9): 1 g via ORAL
  Filled 2020-06-15 (×15): qty 10

## 2020-06-15 MED ORDER — LACTATED RINGERS IV SOLN: INTRAVENOUS | Status: DC

## 2020-06-15 NOTE — Assessment & Plan Note (Signed)
UDS is pending.

## 2020-06-15 NOTE — ED Notes (Addendum)
PA student at bedside speaking with pt. Pt states that she lost consciousness and fell today, then tried to stand up and fainted again. Was walking to home from her car. Pt states that L breast and back of head hurt. Denies vision changes or MSK weakness.

## 2020-06-15 NOTE — ED Provider Notes (Signed)
[facility] Emergency Department Provider Note   ____________________________________________   First MD Initiated Contact with Patient 06/15/20 1838     (approximate)  I have reviewed the triage vital signs and the nursing notes.   HISTORY  Chief Complaint Loss of Consciousness    HPI OMMIE CASALI is a 54 y.o. female history of migraines  Patient reports that she has passed out 4 times today.  Started after she left someone's house she fell back and they saw her fall and hit her head.  She then got up additional times and passed out, once while getting up with EMS.  Reports the back of her head is sore but otherwise feels well.  She reports she feels lightheaded when she is up and moving about.  No chest pain or shortness of breath.   Denies abdominal pain.  Denies any recent illness.  She does smoke.  She denies any use of substances or drugs   Past Medical History:  Diagnosis Date   Migraines     Patient Active Problem List   Diagnosis Date Noted   Syncope and collapse 06/15/2020   Chest pain in adult 06/15/2020   Migraines    Transaminitis 09/10/2019   History of cocaine use 09/22/2018   Tobacco use disorder 10/10/2014    Past Surgical History:  Procedure Laterality Date   CESAREAN SECTION     CHOLECYSTECTOMY      Prior to Admission medications   Medication Sig Start Date End Date Taking? Authorizing Provider  acetaminophen (TYLENOL) 500 MG tablet Take 500 mg by mouth every 6 (six) hours as needed.    [provider]  CALCIUM PO Take 1 tablet by mouth daily.    [provider]  carbamazepine (TEGRETOL-XR) 100 MG 12 hr tablet Take 1 tablet (100 mg total) by mouth 2 (two) times daily. 09/10/16 09/10/17  Rockne Menghini, MD  carisoprodol (SOMA) 350 MG tablet Take 1 tablet (350 mg total) by mouth 3 (three) times daily as needed for muscle spasms. Patient not taking: Reported on 07/25/2019 12/05/15    Myrna Blazer, MD  ferrous sulfate 325 (65 FE) MG tablet Take 325 mg by mouth daily with breakfast.    [provider]  HYDROcodone-acetaminophen (NORCO) 5-325 MG tablet Take 1 tablet by mouth every 6 (six) hours as needed for up to 7 doses for severe pain. Patient not taking: Reported on 07/25/2019 10/02/17   Merrily Brittle, MD  ibuprofen (ADVIL,MOTRIN) 200 MG tablet Take 200 mg by mouth every 6 (six) hours as needed.    [provider]  Multiple Vitamins-Minerals (MULTIVITAMIN WITH MINERALS) tablet Take 1 tablet by mouth daily.    [provider]  omeprazole (PRILOSEC) 40 MG capsule Take 40 mg by mouth daily.    [provider]  ondansetron (ZOFRAN ODT) 4 MG disintegrating tablet Take 1 tablet (4 mg total) by mouth every 8 (eight) hours as needed for nausea or vomiting. 09/05/19   Minna Antis, MD  prochlorperazine (COMPAZINE) 10 MG tablet Take 1 tablet (10 mg total) by mouth every 6 (six) hours as needed for nausea or vomiting. 09/10/16 09/10/17  Rockne Menghini, MD  terbinafine (LAMISIL) 250 MG tablet Take 250 mg by mouth daily.    [provider]    Allergies Patient has no known allergies.  History reviewed. No pertinent family history.  Social History Social History   Tobacco Use   Smoking status: Current Every Day Smoker    Packs/day:  0.50    Types: Cigarettes   Smokeless tobacco: Never Used  Substance Use Topics   Alcohol use: Yes    Comment: occ   Drug use: Yes    Types: Marijuana    Review of Systems Constitutional: No fever/chills the HPI, Eyes: No visual changes. ENT: No sore throat.  Tenderness over the back of her scalp. Cardiovascular: Denies chest pain. Respiratory: Denies shortness of breath. Gastrointestinal: No abdominal pain.   Genitourinary: Negative for dysuria. Musculoskeletal: Negative for back pain. Skin: Negative for rash. Neurological: Negative for headaches, areas of focal weakness  or numbness.    ____________________________________________   PHYSICAL EXAM:  VITAL SIGNS: ED Triage Vitals  Enc Vitals Group     BP 06/15/20 1700 108/87     Pulse Rate 06/15/20 1700 66     Resp 06/15/20 1700 16     Temp 06/15/20 1700 (!) 97.4 F (36.3 C)     Temp Source 06/15/20 1700 Axillary     SpO2 06/15/20 1700 100 %     Weight 06/15/20 1706 170 lb (77.1 kg)     Height 06/15/20 1706 6\' 3"  (1.905 m)     Head Circumference --      Peak Flow --      Pain Score 06/15/20 1706 10     Pain Loc --      Pain Edu? --      Excl. in GC? --     Constitutional: Alert and oriented. Well appearing and in no acute distress. Eyes: Conjunctivae are normal. Head: Atraumatic.  Some tenderness over the posterior occiput to palpation but no hematoma. Nose: No congestion/rhinnorhea. Mouth/Throat: Mucous membranes are moist. Neck: No stridor.  No midline cervical tenderness. Cardiovascular: Normal rate, regular rhythm. Grossly normal heart sounds.  Good peripheral circulation. Respiratory: Normal respiratory effort.  No retractions. Lungs CTAB. Gastrointestinal: Soft and nontender. No distention. Musculoskeletal: No lower extremity tenderness nor edema. Neurologic:  Normal speech and language. No gross focal neurologic deficits are appreciated.  Skin:  Skin is warm, dry and intact. No rash noted. Psychiatric: Mood and affect are normal. Speech and behavior are normal.  ____________________________________________   LABS (all labs ordered are listed, but only abnormal results are displayed)  Labs Reviewed  BASIC METABOLIC PANEL - Abnormal; Notable for the following components:      Result Value   CO2 19 (*)    Glucose, Bld 105 (*)    All other components within normal limits  URINE DRUG SCREEN, QUALITATIVE ([facility] ONLY) - Abnormal; Notable for the following components:   Cocaine Metabolite,Ur [place] POSITIVE (*)    Cannabinoid 50 Ng, Ur  POSITIVE (*)    All other components within  normal limits  HEPATIC FUNCTION PANEL - Abnormal; Notable for the following components:   Total Bilirubin 1.5 (*)    Bilirubin, Direct 0.3 (*)    Indirect Bilirubin 1.2 (*)    All other components within normal limits  RESPIRATORY PANEL BY RT PCR (FLU A&B, COVID)  CBC  MAGNESIUM  ETHANOL  OCCULT BLOOD X 1 CARD TO LAB, STOOL  VITAMIN B12  FOLATE  IRON AND TIBC  FERRITIN  RETICULOCYTES  T4, FREE  TSH  PHOSPHORUS  LACTIC ACID, PLASMA  LACTIC ACID, PLASMA  HEMOGLOBIN A1C  GAMMA GT  CK  FIBRIN DERIVATIVES D-DIMER ([facility] ONLY)  HIV ANTIBODY (ROUTINE TESTING W REFLEX)  LIPID PANEL  RPR  ANA  SEDIMENTATION RATE  C-REACTIVE PROTEIN  TROPONIN I (HIGH SENSITIVITY)  TROPONIN I (HIGH  SENSITIVITY)   ____________________________________________  EKG  Reviewed interpreted 1710 Heart rate 75 QRs 100 QTc 420 Sinus rhythm, nonspecific T wave abnormality noted in multiple leads, no obvious acute ischemic changes ____________________________________________  RADIOLOGY  DG Chest 2 View  Result Date: 06/15/2020 CLINICAL DATA:  Syncope EXAM: CHEST - 2 VIEW COMPARISON:  September 05, 2019 FINDINGS: The cardiomediastinal silhouette is unchanged in contour when accounting for patient rotation and differences in technique. No pleural effusion. No pneumothorax. No acute pleuroparenchymal abnormality. Visualized abdomen is unremarkable. Mild degenerative changes of the thoracic spine. IMPRESSION: No acute cardiopulmonary abnormality. Electronically Signed   By: Meda Klinefelter MD   On: 06/15/2020 18:58   CT Head Wo Contrast  Result Date: 06/15/2020 CLINICAL DATA:  Recurrent syncope EXAM: CT HEAD WITHOUT CONTRAST TECHNIQUE: Contiguous axial images were obtained from the base of the skull through the vertex without intravenous contrast. COMPARISON:  08/27/2016. FINDINGS: Brain: Focal hypodensity within the left basal ganglia is new since previous exam, but has an appearance consistent with  chronic lacunar infarct. No signs of acute infarct or hemorrhage. Lateral ventricles and remaining midline structures are unremarkable. No acute extra-axial fluid collections. No mass effect. Vascular: No hyperdense vessel or unexpected calcification. Skull: Normal. Negative for fracture or focal lesion. Sinuses/Orbits: Left maxillary sinus mucosal thickening. Remaining sinuses are clear. Other: None. IMPRESSION: 1. Chronic appearing left basal ganglia lacunar infarct. 2. No acute intracranial process. Electronically Signed   By: Sharlet Salina M.D.   On: 06/15/2020 18:07   CT Cervical Spine Wo Contrast  Result Date: 06/15/2020 CLINICAL DATA:  Syncope EXAM: CT CERVICAL SPINE WITHOUT CONTRAST TECHNIQUE: Multidetector CT imaging of the cervical spine was performed without intravenous contrast. Multiplanar CT image reconstructions were also generated. COMPARISON:  None. FINDINGS: Alignment: Alignment is anatomic. Skull base and vertebrae: No acute fracture. No primary bone lesion or focal pathologic process. Soft tissues and spinal canal: Thyroid is heterogeneous and enlarged, with multiple hypodense nodules throughout the right lobe largest measuring 1.4 cm. No prevertebral soft tissue swelling. No evidence of canal hematoma. Disc levels: Moderate spondylosis at C4-5 and C5-6. Left predominant neural foraminal narrowing at C4-5. Upper chest: Airway is patent. Lung apices demonstrate bullous emphysematous changes. Other: Reconstructed images demonstrate no additional findings. IMPRESSION: 1. Mild cervical spondylosis.  No acute fracture. 2. Enlarged heterogeneous thyroid with multiple hypodense nodules. Recommend thyroid ultrasound (ref: J Am Coll Radiol. 2015 Feb;12(2): 143-50). Electronically Signed   By: Sharlet Salina M.D.   On: 06/15/2020 18:11    Imaging reviewed, CT head demonstrating some possible old basal ganglia infarct.  Additionally thyroid nodule noted on CT of the cervical spine.  Notable for no  acute findings on cervical spine or head CT head ____________________________________________   PROCEDURES  Procedure(s) performed: None  Procedures  Critical Care performed: No  ____________________________________________   INITIAL IMPRESSION / ASSESSMENT AND PLAN / ED COURSE  Pertinent labs & imaging results that were available during my care of the patient were reviewed by me and considered in my medical decision making (see chart for details).   Patient is for evaluation of multiple syncope.  Clinical history seem to suggest possible orthostatic or vasovagal-like symptomatology, but in taking to affect her labs and evaluation here I also suspect polysubstance abuse potentially cocaine use may also be contributory.  She does have nonspecific T wave abnormality.  But she denies chest pain.  No obvious acute neurologic symptoms.  Due to the patient's history of multiple syncopal episodes, will admit to  the hospitalist for further work-up and management  Clinical Course as of Jun 15 2209  Sat Jun 15, 2020  3295 Delay in seeing (triage 2) due to critical patient in another room.    [MQ]    Clinical Course User Index [MQ] Sharyn Creamer, MD   , Normal CBC, normal renal function slightly low CO2  Patient understanding agreeable with plan for admission ____________________________________________   FINAL CLINICAL IMPRESSION(S) / ED DIAGNOSES  Final diagnoses:  Syncope and collapse  Prolonged Q-T interval on ECG  ECG abnormal  Contusion of scalp, initial encounter        Note:  This document was prepared using Dragon voice recognition software and may include unintentional dictation errors       Sharyn Creamer, MD 06/15/20 2213

## 2020-06-15 NOTE — H&P (Signed)
History and Physical    Christina Avery RNH:[id number] DOB: 1966-02-28 DOA: 06/15/2020  PCP: Patient, No Pcp Per    Patient coming from:  Home   Chief Complaint:  Syncope/LOC   HPI: Christina Avery is a 54 y.o. female with medical history significant of  htn , pt went to visit her friend and fell backwards and hit her head and back, friend held her and she came and to then fell again then she woke up again.she sat on yard and walked to car. Then she drove to her sisters home and there she fell again, she got up and fell and friend up to the house. Pt reports left sided chest pain, pt has been taking BC powder. Last MMG was two years ago. Pt drinks alcohol and smoke, last drink was yesterday. Pt smokes cigarettes and since age of 88 and d/w pt about cessation and to consider cutting down. Pt denies taking any meds at home.    ED Course:  Vitals:   06/15/20 1900 06/15/20 1930 06/15/20 2000 06/15/20 2030  BP: (!) 157/98 (!) 146/97 (!) 159/120 (!) 148/93  Pulse: 60 (!) 59 79 79  Resp: 16 15 20  (!) 23  Temp:      TempSrc:      SpO2: 99% 97% 96% 94%  Weight:      Height:      In ed pt had imagine done as she fell and hit her head.  Labs shows normal bmp, and mild elevated total bilit at 1.5 and normal cbc.    Review of Systems:  Review of Systems  Constitutional: Negative.   HENT: Negative.   Eyes: Negative.   Respiratory: Negative.   Cardiovascular: Positive for chest pain.       Left sided chest pain from behind her left breast at nipple level behind the ribs to left back.No rashes no shingles history   Gastrointestinal: Negative.   Genitourinary: Negative.   Musculoskeletal: Negative.   Skin: Negative.   Neurological: Positive for loss of consciousness and weakness.  Psychiatric/Behavioral: Negative.   All other systems reviewed and are negative.    Past Medical History:  Diagnosis Date  . Migraines     Past Surgical History:  Procedure Laterality Date  .  CESAREAN SECTION    . CHOLECYSTECTOMY       reports that she has been smoking cigarettes. She has been smoking about 0.50 packs per day. She has never used smokeless tobacco. She reports current alcohol use. She reports current drug use. Drug: Marijuana.  No Known Allergies  History reviewed. No pertinent family history.  Prior to Admission medications   Medication Sig Start Date End Date Taking? Authorizing Provider  acetaminophen (TYLENOL) 500 MG tablet Take 500 mg by mouth every 6 (six) hours as needed.    [provider]  CALCIUM PO Take 1 tablet by mouth daily.    [provider]  carbamazepine (TEGRETOL-XR) 100 MG 12 hr tablet Take 1 tablet (100 mg total) by mouth 2 (two) times daily. 09/10/16 09/10/17  Rockne Menghini, MD  carisoprodol (SOMA) 350 MG tablet Take 1 tablet (350 mg total) by mouth 3 (three) times daily as needed for muscle spasms. Patient not taking: Reported on 07/25/2019 12/05/15   Myrna Blazer, MD  ferrous sulfate 325 (65 FE) MG tablet Take 325 mg by mouth daily with breakfast.    [provider]  HYDROcodone-acetaminophen (NORCO) 5-325 MG tablet Take 1 tablet by mouth every 6 (six)  hours as needed for up to 7 doses for severe pain. Patient not taking: Reported on 07/25/2019 10/02/17   Merrily Brittle, MD  ibuprofen (ADVIL,MOTRIN) 200 MG tablet Take 200 mg by mouth every 6 (six) hours as needed.    [provider]  Multiple Vitamins-Minerals (MULTIVITAMIN WITH MINERALS) tablet Take 1 tablet by mouth daily.    [provider]  omeprazole (PRILOSEC) 40 MG capsule Take 40 mg by mouth daily.    [provider]  ondansetron (ZOFRAN ODT) 4 MG disintegrating tablet Take 1 tablet (4 mg total) by mouth every 8 (eight) hours as needed for nausea or vomiting. 09/05/19   Minna Antis, MD  prochlorperazine (COMPAZINE) 10 MG tablet Take 1 tablet (10 mg total) by mouth every 6 (six) hours as needed for nausea or  vomiting. 09/10/16 09/10/17  Rockne Menghini, MD  terbinafine (LAMISIL) 250 MG tablet Take 250 mg by mouth daily.    [provider]    Physical Exam: Vitals:   06/15/20 1900 06/15/20 1930 06/15/20 2000 06/15/20 2030  BP: (!) 157/98 (!) 146/97 (!) 159/120 (!) 148/93  Pulse: 60 (!) 59 79 79  Resp: 16 15 20  (!) 23  Temp:      TempSrc:      SpO2: 99% 97% 96% 94%  Weight:      Height:        Physical Exam Vitals and nursing note reviewed.  Constitutional:      Appearance: Normal appearance.  HENT:     Head: Normocephalic and atraumatic.     Right Ear: External ear normal.     Left Ear: External ear normal.     Mouth/Throat:     Mouth: Mucous membranes are moist.  Eyes:     Extraocular Movements: Extraocular movements intact.     Pupils: Pupils are equal, round, and reactive to light.  Cardiovascular:     Rate and Rhythm: Normal rate and regular rhythm.     Pulses: Normal pulses.     Heart sounds: Normal heart sounds.  Pulmonary:     Effort: Pulmonary effort is normal.     Breath sounds: Wheezing and rhonchi present.  Abdominal:     General: There is no distension.     Palpations: Abdomen is soft. There is no mass.     Tenderness: There is no abdominal tenderness.  Musculoskeletal:        General: Normal range of motion.     Cervical back: Normal range of motion.  Skin:    General: Skin is warm and dry.  Neurological:     General: No focal deficit present.     Mental Status: She is alert and oriented to person, place, and time.  Psychiatric:        Mood and Affect: Mood normal.        Behavior: Behavior normal.     Labs on Admission: I have personally reviewed following labs and imaging studies  CBC: Recent Labs  Lab 06/15/20 1718  WBC 5.3  HGB 14.0  HCT 41.7  MCV 98.6  PLT 219   Basic Metabolic Panel: Recent Labs  Lab 06/15/20 1718  NA 136  K 3.5  CL 105  CO2 19*  GLUCOSE 105*  BUN 14  CREATININE 0.91  CALCIUM 9.1  MG 1.9    GFR: Estimated Creatinine Clearance: 88 mL/min (by C-G formula based on SCr of 0.91 mg/dL). Liver Function Tests: Recent Labs  Lab 06/15/20 1916  AST 29  ALT 19  ALKPHOS 81  BILITOT 1.5*  PROT 7.5  ALBUMIN 3.8   No results for input(s): LIPASE, AMYLASE in the last 168 hours. No results for input(s): AMMONIA in the last 168 hours. Coagulation Profile: No results for input(s): INR, PROTIME in the last 168 hours. Cardiac Enzymes: No results for input(s): CKTOTAL, CKMB, CKMBINDEX, TROPONINI in the last 168 hours. BNP (last 3 results) No results for input(s): PROBNP in the last 8760 hours. HbA1C: No results for input(s): HGBA1C in the last 72 hours. CBG: No results for input(s): GLUCAP in the last 168 hours. Lipid Profile: No results for input(s): CHOL, HDL, LDLCALC, TRIG, CHOLHDL, LDLDIRECT in the last 72 hours. Thyroid Function Tests: No results for input(s): TSH, T4TOTAL, FREET4, T3FREE, THYROIDAB in the last 72 hours. Anemia Panel: No results for input(s): VITAMINB12, FOLATE, FERRITIN, TIBC, IRON, RETICCTPCT in the last 72 hours. Urine analysis: No results found for: COLORURINE, APPEARANCEUR, LABSPEC, PHURINE, GLUCOSEU, HGBUR, BILIRUBINUR, KETONESUR, PROTEINUR, UROBILINOGEN, NITRITE, LEUKOCYTESUR No intake or output data in the 24 hours ending 06/15/20 2059 Lab Results  Component Value Date   CREATININE 0.91 06/15/2020   CREATININE 0.85 09/05/2019   CREATININE 0.65 10/01/2017    COVID-19 Labs No results for input(s): DDIMER, FERRITIN, LDH, CRP in the last 72 hours.  Lab Results  Component Value Date   SARSCOV2NAA NEGATIVE 06/15/2020    Radiological Exams on Admission: DG Chest 2 View  Result Date: 06/15/2020 CLINICAL DATA:  Syncope EXAM: CHEST - 2 VIEW COMPARISON:  September 05, 2019 FINDINGS: The cardiomediastinal silhouette is unchanged in contour when accounting for patient rotation and differences in technique. No pleural effusion. No pneumothorax. No acute  pleuroparenchymal abnormality. Visualized abdomen is unremarkable. Mild degenerative changes of the thoracic spine. IMPRESSION: No acute cardiopulmonary abnormality. Electronically Signed   By: Meda Klinefelter MD   On: 06/15/2020 18:58   CT Head Wo Contrast Result Date: 06/15/2020 CLINICAL DATA:  Recurrent syncope EXAM: CT HEAD WITHOUT CONTRAST TECHNIQUE: Contiguous axial images were obtained from the base of the skull through the vertex without intravenous contrast. COMPARISON:  08/27/2016. FINDINGS: Brain: Focal hypodensity within the left basal ganglia is new since previous exam, but has an appearance consistent with chronic lacunar infarct. No signs of acute infarct or hemorrhage. Lateral ventricles and remaining midline structures are unremarkable. No acute extra-axial fluid collections. No mass effect. Vascular: No hyperdense vessel or unexpected calcification. Skull: Normal. Negative for fracture or focal lesion. Sinuses/Orbits: Left maxillary sinus mucosal thickening. Remaining sinuses are clear. Other: None.  IMPRESSION: 1. Chronic appearing left basal ganglia lacunar infarct. 2. No acute intracranial process. Electronically Signed   By: Sharlet Salina M.D.   On: 06/15/2020 18:07   CT Cervical Spine Wo Contrast Result Date: 06/15/2020 CLINICAL DATA:  Syncope EXAM: CT CERVICAL SPINE WITHOUT CONTRAST TECHNIQUE: Multidetector CT imaging of the cervical spine was performed without intravenous contrast. Multiplanar CT image reconstructions were also generated. COMPARISON:  None. FINDINGS: Alignment: Alignment is anatomic. Skull base and vertebrae: No acute fracture. No primary bone lesion or focal pathologic process. Soft tissues and spinal canal: Thyroid is heterogeneous and enlarged, with multiple hypodense nodules throughout the right lobe largest measuring 1.4 cm. No prevertebral soft tissue swelling. No evidence of canal hematoma. Disc levels: Moderate spondylosis at C4-5 and C5-6. Left  predominant neural foraminal narrowing at C4-5. Upper chest: Airway is patent. Lung apices demonstrate bullous emphysematous changes. Other: Reconstructed images demonstrate no additional findings. IMPRESSION: 1. Mild cervical spondylosis.  No acute fracture. 2. Enlarged heterogeneous thyroid  with multiple hypodense nodules. Recommend thyroid ultrasound (ref: J Am Coll Radiol. 2015 Feb;12(2): 143-50). Electronically Signed   By: Sharlet Salina M.D.   On: 06/15/2020 18:11    EKG: Independently reviewed.  Sinus rhythm 79 lvh,qtc 517.    Assessment/Plan Chest pain in adult Assessment & Plan Left sided chronic chest pain, with abnormal ausculation, d/d include cardiac and noncardiac we will obtain stat cta . Admit with telemetry and monitor for any dysrhythmia. Cycle cardiac enzymes.  Have given her Carafate and Tums as famotidine and Protonix are contraindicated due to prolong qtc.   Syncope and collapse Assessment & Plan Per pt's history is  suspicious for vasovagal or hypotension related. Pt given ivf in ed and we will continue lr overnight. Pt has no h/o high blood pressure and due to her current presentation with elevated bp we will admit in observation status for TIA/Ischemic workup, including mri and carotid doppler and echo. Her ekg shows qtc prolongation which is different from her prior ekg.   Tobacco use disorder Assessment & Plan D/w pt about tobacco cessation and risk of ongoing smoking cigarettes. Nicotine patch.  History of cocaine use Assessment & Plan UDS is pending.   DVT prophylaxis:  heparin   Code Status:  Full code   Family Communication:  Sister Kerby Less [phone]   Disposition Plan:  Home  Consults called:  None  Admission status: Observation.   Gertha Calkin MD [facility] [phone] How to contact the [facility] Attending or Consulting provider 7A - 7P or covering provider during after hours 7P -7A, for this patient?    1. Check the care  team in [facility] and look for a) attending/consulting [facility] provider listed and b) the [facility] team listed 2. Log into [email or web address] and use [place]'s universal password to access. If you do not have the password, please contact the hospital operator. 3. Locate the [facility] provider you are looking for under [facility] and page to a number that you can be directly reached. 4. If you still have difficulty reaching the provider, please page the [facility] (Director on Call) for the Hospitalists listed on amion for assistance. [email or web address] Password [email or web address] 06/15/2020, 8:59 PM

## 2020-06-15 NOTE — Assessment & Plan Note (Signed)
Left sided chronic chest pain, with abnormal ausculation, d/d include cardiac and noncardiac we will obtain stat cta . Admit with telemetry and monitor for any dysrhythmia. Cycle cardiac enzymes.  Have given her Carafate and Tums as famotidine and Protonix are contraindicated due to prolong qtc.

## 2020-06-15 NOTE — ED Notes (Signed)
Pt endorsing 4 episodes of syncope today with +LOC and pain in neck, post head, shoulder and breast. Pt resting In bed.

## 2020-06-15 NOTE — ED Notes (Signed)
Pt taken to xray 

## 2020-06-15 NOTE — Assessment & Plan Note (Addendum)
Per pt's history is  suspicious for vasovagal or hypotension related. Pt given ivf in ed and we will continue lr overnight. Pt has no h/o high blood pressure and due to her current presentation with elevated bp we will admit in observation status for TIA/Ischemic workup, including mri and carotid doppler and echo. Her ekg shows qtc prolongation which is different from her prior ekg.

## 2020-06-15 NOTE — ED Notes (Signed)
Pt given  Yellow socks, wrist band applied and bed alarm turned on. 

## 2020-06-15 NOTE — Assessment & Plan Note (Signed)
D/w pt about tobacco cessation and risk of ongoing smoking cigarettes. Nicotine patch.

## 2020-06-15 NOTE — ED Triage Notes (Signed)
Pt arrives to ER via [facility] for syncope x 4 times today. States will feel a warm feeling across chest and then pass out but is A&Ox4 when wakes up. EMS reports PVC but no heart block. CBG 175. 20 L AC. Arrives in c-collar. Did hit head once.

## 2020-06-15 NOTE — ED Notes (Signed)
Rn on 1A unable to take report.

## 2020-06-15 NOTE — ED Notes (Signed)
C collar removed. Xray notified.

## 2020-06-16 ENCOUNTER — Observation Stay: Admit: 2020-06-16 | Payer: Self-pay

## 2020-06-16 DIAGNOSIS — Z87898 Personal history of other specified conditions: Secondary | ICD-10-CM

## 2020-06-16 LAB — LACTIC ACID, PLASMA: Lactic Acid, Venous: 1.2 mmol/L (ref 0.5–1.9)

## 2020-06-16 LAB — LIPID PANEL
Cholesterol: 156 mg/dL (ref 0–200)
HDL: 59 mg/dL (ref >40)
LDL Cholesterol: 86 mg/dL (ref 0–99)
Total CHOL/HDL Ratio: 2.6 RATIO
Triglycerides: 53 mg/dL (ref <150)
VLDL: 11 mg/dL (ref 0–40)

## 2020-06-16 LAB — FIBRIN DERIVATIVES D-DIMER (ARMC ONLY): Fibrin derivatives D-dimer (ARMC): 530.99 ng/mL (FEU) — ABNORMAL HIGH (ref 0.00–499.00)

## 2020-06-16 LAB — IRON AND TIBC
Iron: 81 ug/dL (ref 28–170)
Saturation Ratios: 20 % (ref 10.4–31.8)
TIBC: 396 ug/dL (ref 250–450)
UIBC: 315 ug/dL

## 2020-06-16 LAB — RPR: RPR Ser Ql: NONREACTIVE

## 2020-06-16 LAB — HEMOGLOBIN A1C
Hgb A1c MFr Bld: 4.7 % — ABNORMAL LOW (ref 4.8–5.6)
Mean Plasma Glucose: 88.19 mg/dL

## 2020-06-16 LAB — VITAMIN B12: Vitamin B-12: 102 pg/mL — ABNORMAL LOW (ref 180–914)

## 2020-06-16 LAB — FERRITIN: Ferritin: 18 ng/mL (ref 11–307)

## 2020-06-16 LAB — SEDIMENTATION RATE: Sed Rate: 11 mm/hr (ref 0–30)

## 2020-06-16 LAB — T4, FREE: Free T4: 0.8 ng/dL (ref 0.61–1.12)

## 2020-06-16 LAB — CK: Total CK: 116 U/L (ref 38–234)

## 2020-06-16 LAB — C-REACTIVE PROTEIN: CRP: 0.5 mg/dL (ref <1.0)

## 2020-06-16 LAB — HIV ANTIBODY (ROUTINE TESTING W REFLEX): HIV Screen 4th Generation wRfx: NONREACTIVE

## 2020-06-16 LAB — PHOSPHORUS: Phosphorus: 3.7 mg/dL (ref 2.5–4.6)

## 2020-06-16 LAB — GAMMA GT: GGT: 80 U/L — ABNORMAL HIGH (ref 7–50)

## 2020-06-16 LAB — TSH: TSH: 0.371 u[IU]/mL (ref 0.350–4.500)

## 2020-06-16 LAB — FOLATE: Folate: 14.6 ng/mL (ref >5.9)

## 2020-06-16 MED ORDER — NICOTINE 14 MG/24HR TD PT24
14.0000 mg | MEDICATED_PATCH | Freq: Every day | TRANSDERMAL | Status: DC
  Filled 2020-06-16 (×3): qty 1

## 2020-06-16 MED ORDER — ATORVASTATIN CALCIUM 10 MG PO TABS
10.0000 mg | ORAL_TABLET | Freq: Every day | ORAL | Status: DC
  Administered 2020-06-16 – 2020-06-18 (×3): 10 mg via ORAL
  Filled 2020-06-16 (×3): qty 1

## 2020-06-16 MED ORDER — ASPIRIN EC 81 MG PO TBEC
81.0000 mg | DELAYED_RELEASE_TABLET | Freq: Every day | ORAL | Status: DC
  Administered 2020-06-16 – 2020-06-18 (×3): 81 mg via ORAL
  Filled 2020-06-16 (×4): qty 1

## 2020-06-16 MED ORDER — AMLODIPINE BESYLATE 5 MG PO TABS
2.5000 mg | ORAL_TABLET | Freq: Every day | ORAL | Status: DC
  Administered 2020-06-16: 2.5 mg via ORAL
  Filled 2020-06-16: qty 1

## 2020-06-16 NOTE — TOC Progression Note (Signed)
Transition of Care [facility]) - Progression Note    Patient Details  Name: Christina Avery MRN: [id number] Date of Birth: Jan 21, 1966  Transition of Care [facility]) CM/SW Contact  Negar Sieler, Johnnette Litter, [place] Phone Number: 06/16/2020, 12:10 PM  Clinical Narrative:   [facility] Team provided resources to patient for substance abuse treatment centers,[facility], Department of Social Services to apply for Medicaid.         Expected Discharge Plan and Services                                                 Social Determinants of Health (SDOH) Interventions    Readmission Risk Interventions No flowsheet data found.

## 2020-06-16 NOTE — Progress Notes (Addendum)
PROGRESS NOTE    Christina Avery  YJE:[id number] DOB: 1965-11-01 DOA: 06/15/2020 PCP: Patient, No Pcp Per    Brief Narrative:  Christina Avery is a 54 y.o. female with medical history significant of  htn , pt went to visit her friend and fell backwards and hit her head and back, friend held her and she came and to then fell again then she woke up again.she sat on yard and walked to car. Then she drove to her sisters home and there she fell again, she got up and fell and friend up to the house. Pt reports left sided chest pain, pt has been taking BC powder. Last MMG was two years ago. Pt drinks alcohol and smoke, last drink was yesterday. Pt smokes cigarettes and since age of 8 and d/w pt about cessation and to consider cutting down. Pt denies taking any meds at home.   10/14-pt stated to me that she did cocaine Friday night. Her "syncope" episodes occurred after that.   Consultants:     Procedures: CT, carotid US  Antimicrobials:       Subjective: Denies cp, sob, dizziness, lightheadeness this am while standing.   Objective: Vitals:   06/16/20 0300 06/16/20 0500 06/16/20 0731 06/16/20 1204  BP: 129/87 (!) 141/94 (!) 129/96 (!) 147/103  Pulse: 81 90 73 62  Resp: 19 16 15 16   Temp: 98.3 F (36.8 C) 98.4 F (36.9 C) 98.2 F (36.8 C) 98 F (36.7 C)  TempSrc: Oral Oral Oral Oral  SpO2: 99% 100% 100% 100%  Weight:      Height:        Intake/Output Summary (Last 24 hours) at 06/16/2020 1305 Last data filed at 06/16/2020 0600 Gross per 24 hour  Intake 900 ml  Output --  Net 900 ml   Filed Weights   06/15/20 1706 06/16/20 0114  Weight: 77.1 kg 79.7 kg    Examination:  General exam: Appears calm and comfortable  Respiratory system: Clear to auscultation. Respiratory effort normal. Cardiovascular system: S1 & S2 heard, RRR. No JVD, murmurs, rubs, gallops or clicks.  Gastrointestinal system: Abdomen is nondistended, soft and nontender. Normal bowel sounds  heard. Central nervous system: Alert and oriented. No focal neurological deficits. Extremities: no edema Skin: warm, dry Psychiatry: Mood & affect appropriate.     Data Reviewed: I have personally reviewed following labs and imaging studies  CBC: Recent Labs  Lab 06/15/20 1718  WBC 5.3  HGB 14.0  HCT 41.7  MCV 98.6  PLT 219   Basic Metabolic Panel: Recent Labs  Lab 06/15/20 1718 06/16/20 0215  NA 136  --   K 3.5  --   CL 105  --   CO2 19*  --   GLUCOSE 105*  --   BUN 14  --   CREATININE 0.91  --   CALCIUM 9.1  --   MG 1.9  --   PHOS  --  3.7   GFR: Estimated Creatinine Clearance: 91 mL/min (by C-G formula based on SCr of 0.91 mg/dL). Liver Function Tests: Recent Labs  Lab 06/15/20 1916  AST 29  ALT 19  ALKPHOS 81  BILITOT 1.5*  PROT 7.5  ALBUMIN 3.8   No results for input(s): LIPASE, AMYLASE in the last 168 hours. No results for input(s): AMMONIA in the last 168 hours. Coagulation Profile: No results for input(s): INR, PROTIME in the last 168 hours. Cardiac Enzymes: Recent Labs  Lab 06/16/20 0215  CKTOTAL 116   BNP (  last 3 results) No results for input(s): PROBNP in the last 8760 hours. HbA1C: Recent Labs    06/15/20 2317  HGBA1C 4.7*   CBG: No results for input(s): GLUCAP in the last 168 hours. Lipid Profile: Recent Labs    06/16/20 0215  CHOL 156  HDL 59  LDLCALC 86  TRIG 53  CHOLHDL 2.6   Thyroid Function Tests: Recent Labs    06/15/20 2317  TSH 0.371  FREET4 0.80   Anemia Panel: Recent Labs    06/15/20 2317  VITAMINB12 102*  FOLATE 14.6  FERRITIN 18  TIBC 396  IRON 81  RETICCTPCT 2.0   Sepsis Labs: Recent Labs  Lab 06/16/20 0130 06/16/20 0215  LATICACIDVEN 1.6 1.2    Recent Results (from the past 240 hour(s))  Respiratory Panel by RT PCR (Flu A&B, Covid) - Nasopharyngeal Swab     Status: None   Collection Time: 06/15/20  7:22 PM   Specimen: Nasopharyngeal Swab  Result Value Ref Range Status   SARS  Coronavirus 2 by RT PCR NEGATIVE NEGATIVE Final    Comment: (NOTE) SARS-CoV-2 target nucleic acids are NOT DETECTED.  The SARS-CoV-2 RNA is generally detectable in upper respiratoy specimens during the acute phase of infection. The lowest concentration of SARS-CoV-2 viral copies this assay can detect is 131 copies/mL. A negative result does not preclude SARS-Cov-2 infection and should not be used as the sole basis for treatment or other patient management decisions. A negative result may occur with  improper specimen collection/handling, submission of specimen other than nasopharyngeal swab, presence of viral mutation(s) within the areas targeted by this assay, and inadequate number of viral copies (<131 copies/mL). A negative result must be combined with clinical observations, patient history, and epidemiological information. The expected result is Negative.  Fact Sheet for Patients:  https://www.moore.com/  Fact Sheet for Healthcare Providers:  https://www.young.biz/  This test is no t yet approved or cleared by the [place] FDA and  has been authorized for detection and/or diagnosis of SARS-CoV-2 by FDA under an Emergency Use Authorization (EUA). This EUA will remain  in effect (meaning this test can be used) for the duration of the COVID-19 declaration under Section 564(b)(1) of the Act, 21 U.S.C. section 360bbb-3(b)(1), unless the authorization is terminated or revoked sooner.     Influenza A by PCR NEGATIVE NEGATIVE Final   Influenza B by PCR NEGATIVE NEGATIVE Final    Comment: (NOTE) The Xpert Xpress SARS-CoV-2/FLU/RSV assay is intended as an aid in  the diagnosis of influenza from Nasopharyngeal swab specimens and  should not be used as a sole basis for treatment. Nasal washings and  aspirates are unacceptable for Xpert Xpress SARS-CoV-2/FLU/RSV  testing.  Fact Sheet for  Patients: https://www.moore.com/  Fact Sheet for Healthcare Providers: https://www.young.biz/  This test is not yet approved or cleared by the [place] FDA and  has been authorized for detection and/or diagnosis of SARS-CoV-2 by  FDA under an Emergency Use Authorization (EUA). This EUA will remain  in effect (meaning this test can be used) for the duration of the  Covid-19 declaration under Section 564(b)(1) of the Act, 21  U.S.C. section 360bbb-3(b)(1), unless the authorization is  terminated or revoked. Performed at [facility], [address], [place], [place] [address]          Radiology Studies: DG Chest 2 View  Result Date: 06/15/2020 CLINICAL DATA:  Syncope EXAM: CHEST - 2 VIEW COMPARISON:  September 05, 2019 FINDINGS: The cardiomediastinal silhouette is  unchanged in contour when accounting for patient rotation and differences in technique. No pleural effusion. No pneumothorax. No acute pleuroparenchymal abnormality. Visualized abdomen is unremarkable. Mild degenerative changes of the thoracic spine. IMPRESSION: No acute cardiopulmonary abnormality. Electronically Signed   By: Meda Klinefelter MD   On: 06/15/2020 18:58   CT Head Wo Contrast  Result Date: 06/15/2020 CLINICAL DATA:  Recurrent syncope EXAM: CT HEAD WITHOUT CONTRAST TECHNIQUE: Contiguous axial images were obtained from the base of the skull through the vertex without intravenous contrast. COMPARISON:  08/27/2016. FINDINGS: Brain: Focal hypodensity within the left basal ganglia is new since previous exam, but has an appearance consistent with chronic lacunar infarct. No signs of acute infarct or hemorrhage. Lateral ventricles and remaining midline structures are unremarkable. No acute extra-axial fluid collections. No mass effect. Vascular: No hyperdense vessel or unexpected calcification. Skull: Normal. Negative for fracture or focal lesion. Sinuses/Orbits: Left  maxillary sinus mucosal thickening. Remaining sinuses are clear. Other: None. IMPRESSION: 1. Chronic appearing left basal ganglia lacunar infarct. 2. No acute intracranial process. Electronically Signed   By: Sharlet Salina M.D.   On: 06/15/2020 18:07   CT ANGIO CHEST PE W OR WO CONTRAST  Result Date: 06/15/2020 CLINICAL DATA:  Respiratory failure, syncope EXAM: CT ANGIOGRAPHY CHEST WITH CONTRAST TECHNIQUE: Multidetector CT imaging of the chest was performed using the standard protocol during bolus administration of intravenous contrast. Multiplanar CT image reconstructions and MIPs were obtained to evaluate the vascular anatomy. CONTRAST:  26mL OMNIPAQUE IOHEXOL 350 MG/ML SOLN COMPARISON:  None. FINDINGS: Cardiovascular: There is adequate opacification of the pulmonary arterial tree. No intraluminal filling defect identified to suggest acute pulmonary embolism. Central pulmonary arteries are of normal caliber. Mild coronary artery calcification predominantly within the mid left anterior descending coronary artery. Global cardiac size is within normal limits. No pericardial effusion. No aortic aneurysm. Mild atherosclerotic calcification within the thoracic aorta. Mediastinum/Nodes: 11 mm nodule within the right thyroid lobe. Further follow-up is not required given its size and the patient's age. No pathologic adenopathy within the thorax. Esophagus unremarkable. Lungs/Pleura: Mild paraseptal emphysema. Mild bibasilar atelectasis, right greater than left. No superimposed focal pulmonary infiltrate. No pneumothorax or pleural effusion. No central obstructing mass. Upper Abdomen: No acute abnormality. Musculoskeletal: No acute bone abnormality. Review of the MIP images confirms the above findings. IMPRESSION: No pulmonary embolism. Moderate coronary artery calcification. Aortic Atherosclerosis (ICD10-I70.0) and Emphysema (ICD10-J43.9). Electronically Signed   By: Helyn Numbers MD   On: 06/15/2020 22:32   CT  Cervical Spine Wo Contrast  Result Date: 06/15/2020 CLINICAL DATA:  Syncope EXAM: CT CERVICAL SPINE WITHOUT CONTRAST TECHNIQUE: Multidetector CT imaging of the cervical spine was performed without intravenous contrast. Multiplanar CT image reconstructions were also generated. COMPARISON:  None. FINDINGS: Alignment: Alignment is anatomic. Skull base and vertebrae: No acute fracture. No primary bone lesion or focal pathologic process. Soft tissues and spinal canal: Thyroid is heterogeneous and enlarged, with multiple hypodense nodules throughout the right lobe largest measuring 1.4 cm. No prevertebral soft tissue swelling. No evidence of canal hematoma. Disc levels: Moderate spondylosis at C4-5 and C5-6. Left predominant neural foraminal narrowing at C4-5. Upper chest: Airway is patent. Lung apices demonstrate bullous emphysematous changes. Other: Reconstructed images demonstrate no additional findings. IMPRESSION: 1. Mild cervical spondylosis.  No acute fracture. 2. Enlarged heterogeneous thyroid with multiple hypodense nodules. Recommend thyroid ultrasound (ref: J Am Coll Radiol. 2015 Feb;12(2): 143-50). Electronically Signed   By: Sharlet Salina M.D.   On: 06/15/2020 18:11   MR  BRAIN WO CONTRAST  Result Date: 06/15/2020 CLINICAL DATA:  Recurrent syncope.  Multiple falls. EXAM: MRI HEAD WITHOUT CONTRAST TECHNIQUE: Multiplanar, multiecho pulse sequences of the brain and surrounding structures were obtained without intravenous contrast. COMPARISON:  Head CT 06/15/2020 and MRI 08/27/2016 FINDINGS: Brain: There is no evidence of an acute infarct, intracranial hemorrhage, mass, midline shift, or extra-axial fluid collection. The ventricles and sulci are normal. There is patchy T2 hyperintensity in the deep cerebral white matter bilaterally, basal ganglia, thalami and pons which has greatly progressed from 2018, and there is a new discrete chronic lacunar infarct in the genu of the left internal capsule. A  partially empty sella is unchanged. Vascular: Major intracranial vascular flow voids are preserved. Skull and upper cervical spine: Major intracranial vascular flow voids are preserved. Sinuses/Orbits: Unremarkable orbits. Mild circumferential mucosal thickening in the left maxillary sinus. Clear mastoid air cells. Other: None. IMPRESSION: 1. No acute intracranial abnormality. 2. Progressive T2 signal abnormality in the deep cerebral white matter, deep gray nuclei, and pons since 2018 with an interval chronic lacunar infarct in the left internal capsule. This may reflect progressive chronic small vessel ischemic disease, vasculitis, or hypercoagulable state. Electronically Signed   By: Sebastian Ache M.D.   On: 06/15/2020 21:49   US Carotid Bilateral (at [facility] and [facility] only)  Result Date: 06/16/2020 CLINICAL DATA:  Syncope and hypertension. EXAM: BILATERAL CAROTID DUPLEX ULTRASOUND TECHNIQUE: Wallace Cullens scale imaging, color Doppler and duplex ultrasound were performed of bilateral carotid and vertebral arteries in the neck. COMPARISON:  None. FINDINGS: Criteria: Quantification of carotid stenosis is based on velocity parameters that correlate the residual internal carotid diameter with NASCET-based stenosis levels, using the diameter of the distal internal carotid lumen as the denominator for stenosis measurement. The following velocity measurements were obtained: RIGHT ICA:  70/27 cm/sec CCA:  79/24 cm/sec SYSTOLIC ICA/CCA RATIO:  0.9 ECA:  49 cm/sec LEFT ICA:  94/38 cm/sec CCA:  59/16 cm/sec SYSTOLIC ICA/CCA RATIO:  1.6 ECA:  59 cm/sec RIGHT CAROTID ARTERY: Mild calcified plaque is present at the level of the distal bulb and proximal right ICA. Estimated right ICA stenosis is less than 50%. RIGHT VERTEBRAL ARTERY: Antegrade flow with normal waveform and velocity. LEFT CAROTID ARTERY: Minimal plaque in the carotid bulb. No evidence of left ICA plaque or stenosis. LEFT VERTEBRAL ARTERY: Antegrade flow with normal waveform  and velocity. IMPRESSION: Mild plaque in both carotid bulbs and the proximal right ICA. Estimated right ICA stenosis is less than 50%. There is no evidence of left ICA stenosis. Electronically Signed   By: Irish Lack M.D.   On: 06/16/2020 11:03        Scheduled Meds: .  stroke: mapping our early stages of recovery book   Does not apply Once  . amLODipine  2.5 mg Oral Daily  . calcium carbonate  1 tablet Oral TID WC  . heparin  5,000 Units Subcutaneous Q8H  . influenza vac split quadrivalent PF  0.5 mL Intramuscular Tomorrow-1000  . magnesium oxide  400 mg Oral Daily  . sucralfate  1 g Oral TID WC & HS  . thiamine  100 mg Oral Daily   Continuous Infusions: . lactated ringers 75 mL/hr at 06/15/20 2259    Assessment & Plan:   Active Problems:   History of cocaine use   Tobacco use disorder   Syncope and collapse   Chest pain in adult   Cp- CTA negative for PE, CT revealed moderate coronary calcification Possibly her chest  pain was due to cocaine causing vasospasm troponins are flat This a.m. asymptomatic We will need to follow-up with PCP as outpatient for stress testing Will start her on asa and statin as LFT nml LDL goal <70 Counseled patient against using cocaine as it can cause coronary events  Syncope/collapse- etiology unclear.  Negative orthostatics.  CT head Chronic appearing left basal ganglia lacunar infarct bp control Echo pending Carotid ultrasound without hemodynamically significant stenosis   Multiple thyroid nodules-incidental findings on CT scan  Thyroid ultrasound is recommended, discussed with patient she will need to follow-up with a primary care to have this done for further evaluation   Tobacco abuse Patient counseled on admission Will start nicotine patch  Drug abuse- tox positive for cocaine and cannabinoid Counseled pt about stopping doing cocaine, risk/complications d/w her.  Accelerated HTN- needs improvement Will start with low  dose amlodipine Avoid beta blk due to cocaine use  DVT prophylaxis: Heparin subcu Code Status: Full Family Communication: None at bedside  Status is: Observation  The patient remains OBS appropriate and will d/c before 2 midnights.  Dispo: The patient is from: Home              Anticipated d/c is to: Home              Anticipated d/c date is: 1 day              Patient currently is not medically stable to d/c.Echo pending, d/c pending on echo completion and findings            LOS: 0 days   Time spent: 35 min with >50% on coc    Lynn Ito, MD [facility] Pager [phone]  If 7PM-7AM, please contact night-coverage [email or web address] Password [email or web address] 06/16/2020, 1:05 PM

## 2020-06-16 NOTE — Progress Notes (Signed)
Pt admitted to unit this shift. She passed swallow screen and no neuro deficits have been noted. Pt admits to drug use and has lost 20lbs over the past 6 mos due to this. She states she only drinks occasionally. Pt states pain is a 10 in her neck, but has not felt light-headed this shift.

## 2020-06-16 NOTE — Evaluation (Signed)
Physical Therapy Evaluation Patient Details Name: Christina Avery MRN: [id number] DOB: [birth year] Today's Date: 06/16/2020   History of Present Illness  Pt is a 54 y/o F admitted on 06/15/20  via EMS for syncope x 4 resulting in hitting head x 1 & (+) LOC on that date; pt also with c/o L breast & posterior head pain. CT reveals chronic L basal ganglia lacunar infarct but no acute processes. PMH: HTN, cocaine & tobacco use, migraines  Clinical Impression  Pt is currently at supervision<>mod I level for mobility without AD. Pt's main c/o is neck pain - nursing aware. At this time there are no apparent PT needs. Anticipate pt can go home at CLOF.     Follow Up Recommendations No PT follow up    Equipment Recommendations  None recommended by PT    Recommendations for Other Services       Precautions / Restrictions Precautions Precautions: None Restrictions Weight Bearing Restrictions: No      Mobility  Bed Mobility Overal bed mobility: Independent                  Transfers Overall transfer level: Independent                  Ambulation/Gait Ambulation/Gait assistance: Modified independent (Device/Increase time) Gait Distance (Feet): 200 Feet     Gait velocity: WNL   General Gait Details: Pt with 1 slight staggering episode but able to correct without physical assist.  Stairs Stairs: Yes Stairs assistance: Supervision   Number of Stairs: 4 General stair comments: without rails to ascend, B rails to descend  Wheelchair Mobility    Modified Rankin (Stroke Patients Only)       Balance Overall balance assessment: Needs assistance (appears WNL)   Sitting balance-Leahy Scale: Normal     Standing balance support: No upper extremity supported;During functional activity Standing balance-Leahy Scale: Good Standing balance comment: no UE support during gait, pt elects to hold to B rails to descend stairs                              Pertinent Vitals/Pain Pain Assessment: 0-10 Pain Score: 10-Worst pain ever Pain Location: neck Pain Descriptors / Indicators: Constant;Discomfort;Aching;Sore Pain Intervention(s): Monitored during session (pt reports nurse gave pain medication at beginning of session)    Home Living Family/patient expects to be discharged to:: Private residence Living Arrangements: Other relatives (sister) Available Help at Discharge: Family;Available PRN/intermittently (sister works as a Engineer, civil (consulting) at [employer]) Type of Home: [employer] Access: Stairs to enter Entrance Stairs-Rails: Left;Right;Can reach both Secretary/administrator of Steps: 2-3 Home Layout: One level Home Equipment: Cane - single point;Walker - 2 wheels      Prior Function Level of Independence: Independent         Comments: Independent without AD PTA. Pt scheduled & hoping to start job at [employer] this coming week.     Hand Dominance        Extremity/Trunk Assessment   Upper Extremity Assessment Upper Extremity Assessment: Overall WFL for tasks assessed    Lower Extremity Assessment Lower Extremity Assessment: Overall WFL for tasks assessed    Cervical / Trunk Assessment Cervical / Trunk Assessment:  (limited cervical rotation & flexion/extension 2/2 pain, pt instead turns body vs neck; rigid cervical spine)  Communication   Communication: No difficulties  Cognition Arousal/Alertness: Awake/alert Behavior During Therapy: WFL for tasks assessed/performed Overall Cognitive Status: Within Functional Limits  for tasks assessed                                        General Comments      Exercises     Assessment/Plan    PT Assessment Patent does not need any further PT services  PT Problem List         PT Treatment Interventions      PT Goals (Current goals can be found in the Care Plan section)  Acute Rehab PT Goals Patient Stated Goal: decreased neck pain PT Goal Formulation: With  patient Time For Goal Achievement: 06/30/20 Potential to Achieve Goals: Good    Frequency     Barriers to discharge        Co-evaluation               AM-PAC PT "6 Clicks" Mobility  Outcome Measure Help needed turning from your back to your side while in a flat bed without using bedrails?: None Help needed moving from lying on your back to sitting on the side of a flat bed without using bedrails?: None Help needed moving to and from a bed to a chair (including a wheelchair)?: None Help needed standing up from a chair using your arms (e.g., wheelchair or bedside chair)?: None Help needed to walk in hospital room?: None Help needed climbing 3-5 steps with a railing? : None 6 Click Score: 24    End of Session Equipment Utilized During Treatment: Gait belt Activity Tolerance: Patient tolerated treatment well Patient left: in chair;with call bell/phone within reach;with chair alarm set Nurse Communication: Mobility status      Time: 1400-1414 PT Time Calculation (min) (ACUTE ONLY): 14 min   Charges:   PT Evaluation $PT Eval Low Complexity: 1 Low          Aleda Grana, PT, DPT 06/16/20, 3:57 PM   Sandi Mariscal 06/16/2020, 3:56 PM

## 2020-06-16 NOTE — Progress Notes (Signed)
OT Cancellation Note  Patient Details Name: Christina Avery MRN: [id number] DOB: October 26, 1965   Cancelled Treatment:    Reason Eval/Treat Not Completed: OT screened, no needs identified, will sign off. OT order received and chart reviewed. Upon arrival pt in bathroom completing toileting and standing grooming c SUPERVISION only. Pt and RN in room deny acute OT needs, pt reports at functional baseline. Will sign off. Please re-consult if new needs arise.   Kathie Dike, M.S. OTR/L  06/16/20, 9:25 AM  ascom [phone]

## 2020-06-16 NOTE — Progress Notes (Signed)
Ch visited with Pt in response to OR for AD. Pt stated that she did not want to complete HPOA document, and started fanning herself with AD packet stating she was feeling hot. Pt asked Ch to pray about her drug abuse/dependence. Ch prayed with her. Pt wanted to hold on to AD, to look over.

## 2020-06-17 ENCOUNTER — Observation Stay (HOSPITAL_BASED_OUTPATIENT_CLINIC_OR_DEPARTMENT_OTHER)
Admit: 2020-06-17 | Discharge: 2020-06-17 | Disposition: A | Discharge status: Home / Self Care | Payer: Self-pay | Attending: Internal Medicine | Admitting: Internal Medicine

## 2020-06-17 DIAGNOSIS — E876 Hypokalemia: Secondary | ICD-10-CM

## 2020-06-17 DIAGNOSIS — I361 Nonrheumatic tricuspid (valve) insufficiency: Secondary | ICD-10-CM

## 2020-06-17 DIAGNOSIS — R55 Syncope and collapse: Principal | ICD-10-CM

## 2020-06-17 DIAGNOSIS — Z720 Tobacco use: Secondary | ICD-10-CM

## 2020-06-17 DIAGNOSIS — F1490 Cocaine use, unspecified, uncomplicated: Secondary | ICD-10-CM

## 2020-06-17 DIAGNOSIS — I272 Pulmonary hypertension, unspecified: Secondary | ICD-10-CM

## 2020-06-17 DIAGNOSIS — F1010 Alcohol abuse, uncomplicated: Secondary | ICD-10-CM

## 2020-06-17 DIAGNOSIS — I5042 Chronic combined systolic (congestive) and diastolic (congestive) heart failure: Secondary | ICD-10-CM

## 2020-06-17 DIAGNOSIS — I340 Nonrheumatic mitral (valve) insufficiency: Secondary | ICD-10-CM

## 2020-06-17 LAB — ECHOCARDIOGRAM COMPLETE
AR max vel: 1.86 cm2
AV Area VTI: 2.47 cm2
AV Area mean vel: 1.93 cm2
AV Mean grad: 4 mmHg
AV Peak grad: 6.5 mmHg
Ao pk vel: 1.27 m/s
Area-P 1/2: 2.2 cm2
Calc EF: 38.5 %
Height: 75 in
S' Lateral: 3.78 cm
Single Plane A2C EF: 43.6 %
Single Plane A4C EF: 31.6 %
Weight: 2811.31 oz

## 2020-06-17 LAB — ANA: Anti Nuclear Antibody (ANA): NEGATIVE

## 2020-06-17 LAB — BASIC METABOLIC PANEL
Anion gap: 11 (ref 5–15)
BUN: 9 mg/dL (ref 6–20)
CO2: 20 mmol/L — ABNORMAL LOW (ref 22–32)
Calcium: 9.4 mg/dL (ref 8.9–10.3)
Chloride: 104 mmol/L (ref 98–111)
Creatinine, Ser: 0.82 mg/dL (ref 0.44–1.00)
GFR, Estimated: >60 mL/min (ref >60)
Glucose, Bld: 139 mg/dL — ABNORMAL HIGH (ref 70–99)
Potassium: 3.7 mmol/L (ref 3.5–5.1)
Sodium: 135 mmol/L (ref 135–145)

## 2020-06-17 LAB — CBC
HCT: 40.4 % (ref 36.0–46.0)
Hemoglobin: 13.8 g/dL (ref 12.0–15.0)
MCH: 32.9 pg (ref 26.0–34.0)
MCHC: 34.2 g/dL (ref 30.0–36.0)
MCV: 96.2 fL (ref 80.0–100.0)
Platelets: 226 10*3/uL (ref 150–400)
RBC: 4.2 MIL/uL (ref 3.87–5.11)
RDW: 11.6 % (ref 11.5–15.5)
WBC: 4.7 10*3/uL (ref 4.0–10.5)
nRBC: 0 % (ref 0.0–0.2)

## 2020-06-17 MED ORDER — LOSARTAN POTASSIUM 25 MG PO TABS
25.0000 mg | ORAL_TABLET | Freq: Every day | ORAL | Status: DC
  Administered 2020-06-17 – 2020-06-18 (×2): 25 mg via ORAL
  Filled 2020-06-17 (×2): qty 1

## 2020-06-17 MED ORDER — ADULT MULTIVITAMIN W/MINERALS CH
1.0000 | ORAL_TABLET | Freq: Every day | ORAL | Status: DC
  Administered 2020-06-18: 1 via ORAL
  Filled 2020-06-17: qty 1

## 2020-06-17 MED ORDER — ENSURE ENLIVE PO LIQD
237.0000 mL | Freq: Three times a day (TID) | ORAL | Status: DC
  Administered 2020-06-17 – 2020-06-18 (×3): 237 mL via ORAL

## 2020-06-17 MED ORDER — VITAMIN B-12 1000 MCG PO TABS
1000.0000 ug | ORAL_TABLET | Freq: Every day | ORAL | Status: DC
  Administered 2020-06-17: 1000 ug via ORAL
  Filled 2020-06-17: qty 1

## 2020-06-17 MED ORDER — CARVEDILOL 3.125 MG PO TABS
3.1250 mg | ORAL_TABLET | Freq: Two times a day (BID) | ORAL | Status: DC
  Administered 2020-06-17 – 2020-06-18 (×2): 3.125 mg via ORAL
  Filled 2020-06-17 (×2): qty 1

## 2020-06-17 MED ORDER — AMLODIPINE BESYLATE 5 MG PO TABS
5.0000 mg | ORAL_TABLET | Freq: Every day | ORAL | Status: DC
  Administered 2020-06-17: 5 mg via ORAL
  Filled 2020-06-17: qty 1

## 2020-06-17 NOTE — Progress Notes (Signed)
PROGRESS NOTE    Christina Avery  ZOX:[id number] DOB: [birth year] DOA: 06/15/2020 PCP: Patient, No Pcp Per    Brief Narrative:  Christina Avery is a 54 y.o. female with medical history significant of  htn , pt went to visit her friend and fell backwards and hit her head and back, friend held her and she came and to then fell again then she woke up again.she sat on yard and walked to car. Then she drove to her sisters home and there she fell again, she got up and fell and friend up to the house. Pt reports left sided chest pain, pt has been taking BC powder. Last MMG was two years ago. Pt drinks alcohol and smoke, last drink was yesterday. Pt smokes cigarettes and since age of 68 and d/w pt about cessation and to consider cutting down. Pt denies taking any meds at home.   10/14-pt stated to me that she did cocaine Friday night. Her "syncope" episodes occurred after that.   11/14-had cp again this am. Cardiology consulted as echo with new CM. Tele with pvc's   Consultants:     Procedures: CT, carotid US  Antimicrobials:       Subjective: Currently cp free, no sob. No other complaints  Objective: Vitals:   06/17/20 0444 06/17/20 0740 06/17/20 0800 06/17/20 1221  BP: (!) 138/124 (!) 138/97  (!) 150/104  Pulse: (!) 52 (!) 47 (!) 50 67  Resp: Temp: 98 F (36.7 C) 97.8 F (36.6 C)  98.4 F (36.9 C)  TempSrc:  Oral  Oral  SpO2: 93% 100%  100%  Weight:      Height:        Intake/Output Summary (Last 24 hours) at 06/17/2020 1428 Last data filed at 06/17/2020 1014 Gross per 24 hour  Intake 1352.1 ml  Output 300 ml  Net 1052.1 ml   Filed Weights   06/15/20 1706 06/16/20 0114  Weight: 77.1 kg 79.7 kg    Examination:  Lying in bed, NAD CTA no wheeze rales rhonchi's Regular S1-S2 no murmurs Soft benign positive bowel sounds No edema Alert oriented x3    Data Reviewed: I have personally reviewed following labs and imaging studies  CBC: Recent  Labs  Lab 06/15/20 1718 06/17/20 1343  WBC 5.3 4.7  HGB 14.0 13.8  HCT 41.7 40.4  MCV 98.6 96.2  PLT 219 226   Basic Metabolic Panel: Recent Labs  Lab 06/15/20 1718 06/16/20 0215 06/17/20 1343  NA 136  --  135  K 3.5  --  3.7  CL 105  --  104  CO2 19*  --  20*  GLUCOSE 105*  --  139*  BUN 14  --  9  CREATININE 0.91  --  0.82  CALCIUM 9.1  --  9.4  MG 1.9  --   --   PHOS  --  3.7  --    GFR: Estimated Creatinine Clearance: 101 mL/min (by C-G formula based on SCr of 0.82 mg/dL). Liver Function Tests: Recent Labs  Lab 06/15/20 1916  AST 29  ALT 19  ALKPHOS 81  BILITOT 1.5*  PROT 7.5  ALBUMIN 3.8   No results for input(s): LIPASE, AMYLASE in the last 168 hours. No results for input(s): AMMONIA in the last 168 hours. Coagulation Profile: No results for input(s): INR, PROTIME in the last 168 hours. Cardiac Enzymes: Recent Labs  Lab 06/16/20 0215  CKTOTAL 116   BNP (last 3  results) No results for input(s): PROBNP in the last 8760 hours. HbA1C: Recent Labs    06/15/20 2317  HGBA1C 4.7*   CBG: No results for input(s): GLUCAP in the last 168 hours. Lipid Profile: Recent Labs    06/16/20 0215  CHOL 156  HDL 59  LDLCALC 86  TRIG 53  CHOLHDL 2.6   Thyroid Function Tests: Recent Labs    06/15/20 2317  TSH 0.371  FREET4 0.80   Anemia Panel: Recent Labs    06/15/20 2317  VITAMINB12 102*  FOLATE 14.6  FERRITIN 18  TIBC 396  IRON 81  RETICCTPCT 2.0   Sepsis Labs: Recent Labs  Lab 06/16/20 0130 06/16/20 0215  LATICACIDVEN 1.6 1.2    Recent Results (from the past 240 hour(s))  Respiratory Panel by RT PCR (Flu A&B, Covid) - Nasopharyngeal Swab     Status: None   Collection Time: 06/15/20  7:22 PM   Specimen: Nasopharyngeal Swab  Result Value Ref Range Status   SARS Coronavirus 2 by RT PCR NEGATIVE NEGATIVE Final    Comment: (NOTE) SARS-CoV-2 target nucleic acids are NOT DETECTED.  The SARS-CoV-2 RNA is generally detectable in upper  respiratoy specimens during the acute phase of infection. The lowest concentration of SARS-CoV-2 viral copies this assay can detect is 131 copies/mL. A negative result does not preclude SARS-Cov-2 infection and should not be used as the sole basis for treatment or other patient management decisions. A negative result may occur with  improper specimen collection/handling, submission of specimen other than nasopharyngeal swab, presence of viral mutation(s) within the areas targeted by this assay, and inadequate number of viral copies (<131 copies/mL). A negative result must be combined with clinical observations, patient history, and epidemiological information. The expected result is Negative.  Fact Sheet for Patients:  https://www.moore.com/  Fact Sheet for Healthcare Providers:  https://www.young.biz/  This test is no t yet approved or cleared by the [place] FDA and  has been authorized for detection and/or diagnosis of SARS-CoV-2 by FDA under an Emergency Use Authorization (EUA). This EUA will remain  in effect (meaning this test can be used) for the duration of the COVID-19 declaration under Section 564(b)(1) of the Act, 21 U.S.C. section 360bbb-3(b)(1), unless the authorization is terminated or revoked sooner.     Influenza A by PCR NEGATIVE NEGATIVE Final   Influenza B by PCR NEGATIVE NEGATIVE Final    Comment: (NOTE) The Xpert Xpress SARS-CoV-2/FLU/RSV assay is intended as an aid in  the diagnosis of influenza from Nasopharyngeal swab specimens and  should not be used as a sole basis for treatment. Nasal washings and  aspirates are unacceptable for Xpert Xpress SARS-CoV-2/FLU/RSV  testing.  Fact Sheet for Patients: https://www.moore.com/  Fact Sheet for Healthcare Providers: https://www.young.biz/  This test is not yet approved or cleared by the [place] FDA and  has been  authorized for detection and/or diagnosis of SARS-CoV-2 by  FDA under an Emergency Use Authorization (EUA). This EUA will remain  in effect (meaning this test can be used) for the duration of the  Covid-19 declaration under Section 564(b)(1) of the Act, 21  U.S.C. section 360bbb-3(b)(1), unless the authorization is  terminated or revoked. Performed at [facility], [address], [place], [place] [address]          Radiology Studies: DG Chest 2 View  Result Date: 06/15/2020 CLINICAL DATA:  Syncope EXAM: CHEST - 2 VIEW COMPARISON:  September 05, 2019 FINDINGS: The cardiomediastinal silhouette is unchanged in  contour when accounting for patient rotation and differences in technique. No pleural effusion. No pneumothorax. No acute pleuroparenchymal abnormality. Visualized abdomen is unremarkable. Mild degenerative changes of the thoracic spine. IMPRESSION: No acute cardiopulmonary abnormality. Electronically Signed   By: Meda Klinefelter MD   On: 06/15/2020 18:58   CT Head Wo Contrast  Result Date: 06/15/2020 CLINICAL DATA:  Recurrent syncope EXAM: CT HEAD WITHOUT CONTRAST TECHNIQUE: Contiguous axial images were obtained from the base of the skull through the vertex without intravenous contrast. COMPARISON:  08/27/2016. FINDINGS: Brain: Focal hypodensity within the left basal ganglia is new since previous exam, but has an appearance consistent with chronic lacunar infarct. No signs of acute infarct or hemorrhage. Lateral ventricles and remaining midline structures are unremarkable. No acute extra-axial fluid collections. No mass effect. Vascular: No hyperdense vessel or unexpected calcification. Skull: Normal. Negative for fracture or focal lesion. Sinuses/Orbits: Left maxillary sinus mucosal thickening. Remaining sinuses are clear. Other: None. IMPRESSION: 1. Chronic appearing left basal ganglia lacunar infarct. 2. No acute intracranial process. Electronically Signed   By: Sharlet Salina M.D.   On: 06/15/2020 18:07   CT ANGIO CHEST PE W OR WO CONTRAST  Result Date: 06/15/2020 CLINICAL DATA:  Respiratory failure, syncope EXAM: CT ANGIOGRAPHY CHEST WITH CONTRAST TECHNIQUE: Multidetector CT imaging of the chest was performed using the standard protocol during bolus administration of intravenous contrast. Multiplanar CT image reconstructions and MIPs were obtained to evaluate the vascular anatomy. CONTRAST:  22mL OMNIPAQUE IOHEXOL 350 MG/ML SOLN COMPARISON:  None. FINDINGS: Cardiovascular: There is adequate opacification of the pulmonary arterial tree. No intraluminal filling defect identified to suggest acute pulmonary embolism. Central pulmonary arteries are of normal caliber. Mild coronary artery calcification predominantly within the mid left anterior descending coronary artery. Global cardiac size is within normal limits. No pericardial effusion. No aortic aneurysm. Mild atherosclerotic calcification within the thoracic aorta. Mediastinum/Nodes: 11 mm nodule within the right thyroid lobe. Further follow-up is not required given its size and the patient's age. No pathologic adenopathy within the thorax. Esophagus unremarkable. Lungs/Pleura: Mild paraseptal emphysema. Mild bibasilar atelectasis, right greater than left. No superimposed focal pulmonary infiltrate. No pneumothorax or pleural effusion. No central obstructing mass. Upper Abdomen: No acute abnormality. Musculoskeletal: No acute bone abnormality. Review of the MIP images confirms the above findings. IMPRESSION: No pulmonary embolism. Moderate coronary artery calcification. Aortic Atherosclerosis (ICD10-I70.0) and Emphysema (ICD10-J43.9). Electronically Signed   By: Helyn Numbers MD   On: 06/15/2020 22:32   CT Cervical Spine Wo Contrast  Result Date: 06/15/2020 CLINICAL DATA:  Syncope EXAM: CT CERVICAL SPINE WITHOUT CONTRAST TECHNIQUE: Multidetector CT imaging of the cervical spine was performed without intravenous  contrast. Multiplanar CT image reconstructions were also generated. COMPARISON:  None. FINDINGS: Alignment: Alignment is anatomic. Skull base and vertebrae: No acute fracture. No primary bone lesion or focal pathologic process. Soft tissues and spinal canal: Thyroid is heterogeneous and enlarged, with multiple hypodense nodules throughout the right lobe largest measuring 1.4 cm. No prevertebral soft tissue swelling. No evidence of canal hematoma. Disc levels: Moderate spondylosis at C4-5 and C5-6. Left predominant neural foraminal narrowing at C4-5. Upper chest: Airway is patent. Lung apices demonstrate bullous emphysematous changes. Other: Reconstructed images demonstrate no additional findings. IMPRESSION: 1. Mild cervical spondylosis.  No acute fracture. 2. Enlarged heterogeneous thyroid with multiple hypodense nodules. Recommend thyroid ultrasound (ref: J Am Coll Radiol. 2015 Feb;12(2): 143-50). Electronically Signed   By: Sharlet Salina M.D.   On: 06/15/2020 18:11   MR BRAIN WO  CONTRAST  Result Date: 06/15/2020 CLINICAL DATA:  Recurrent syncope.  Multiple falls. EXAM: MRI HEAD WITHOUT CONTRAST TECHNIQUE: Multiplanar, multiecho pulse sequences of the brain and surrounding structures were obtained without intravenous contrast. COMPARISON:  Head CT 06/15/2020 and MRI 08/27/2016 FINDINGS: Brain: There is no evidence of an acute infarct, intracranial hemorrhage, mass, midline shift, or extra-axial fluid collection. The ventricles and sulci are normal. There is patchy T2 hyperintensity in the deep cerebral white matter bilaterally, basal ganglia, thalami and pons which has greatly progressed from 2018, and there is a new discrete chronic lacunar infarct in the genu of the left internal capsule. A partially empty sella is unchanged. Vascular: Major intracranial vascular flow voids are preserved. Skull and upper cervical spine: Major intracranial vascular flow voids are preserved. Sinuses/Orbits: Unremarkable  orbits. Mild circumferential mucosal thickening in the left maxillary sinus. Clear mastoid air cells. Other: None. IMPRESSION: 1. No acute intracranial abnormality. 2. Progressive T2 signal abnormality in the deep cerebral white matter, deep gray nuclei, and pons since 2018 with an interval chronic lacunar infarct in the left internal capsule. This may reflect progressive chronic small vessel ischemic disease, vasculitis, or hypercoagulable state. Electronically Signed   By: Sebastian Ache M.D.   On: 06/15/2020 21:49   US Carotid Bilateral (at [facility] and [facility] only)  Result Date: 06/16/2020 CLINICAL DATA:  Syncope and hypertension. EXAM: BILATERAL CAROTID DUPLEX ULTRASOUND TECHNIQUE: Wallace Cullens scale imaging, color Doppler and duplex ultrasound were performed of bilateral carotid and vertebral arteries in the neck. COMPARISON:  None. FINDINGS: Criteria: Quantification of carotid stenosis is based on velocity parameters that correlate the residual internal carotid diameter with NASCET-based stenosis levels, using the diameter of the distal internal carotid lumen as the denominator for stenosis measurement. The following velocity measurements were obtained: RIGHT ICA:  70/27 cm/sec CCA:  79/24 cm/sec SYSTOLIC ICA/CCA RATIO:  0.9 ECA:  49 cm/sec LEFT ICA:  94/38 cm/sec CCA:  59/16 cm/sec SYSTOLIC ICA/CCA RATIO:  1.6 ECA:  59 cm/sec RIGHT CAROTID ARTERY: Mild calcified plaque is present at the level of the distal bulb and proximal right ICA. Estimated right ICA stenosis is less than 50%. RIGHT VERTEBRAL ARTERY: Antegrade flow with normal waveform and velocity. LEFT CAROTID ARTERY: Minimal plaque in the carotid bulb. No evidence of left ICA plaque or stenosis. LEFT VERTEBRAL ARTERY: Antegrade flow with normal waveform and velocity. IMPRESSION: Mild plaque in both carotid bulbs and the proximal right ICA. Estimated right ICA stenosis is less than 50%. There is no evidence of left ICA stenosis. Electronically Signed   By: Irish Lack M.D.   On: 06/16/2020 11:03   ECHOCARDIOGRAM COMPLETE  Result Date: 06/17/2020    ECHOCARDIOGRAM REPORT   Patient Name:   TAVI GAUGHRAN Date of Exam: 06/17/2020 Medical Rec #:  [id number]        Height:       75.0 in Accession #:    [id number]       Weight:       175.7 lb Date of Birth:  Apr 04, 1966        BSA:          2.077 m Patient Age:    54 years         BP:           138/97 mmHg Patient Gender: F                HR:           50 bpm. Exam  Location:  [facility] Procedure: 2D Echo, Cardiac Doppler and Color Doppler Indications:     Syncope 780.2  History:         Patient has no prior history of Echocardiogram examinations.                  Migraines.  Sonographer:     Cristela Blue RDCS (AE) Referring Phys:  [id number] Eliezer Mccoy PATEL Diagnosing Phys: Lorine Bears MD IMPRESSIONS  1. Left ventricular ejection fraction, by estimation, is 40 to 45%. The left ventricle has mildly decreased function. The left ventricle demonstrates global hypokinesis. Left ventricular diastolic parameters are consistent with Grade I diastolic dysfunction (impaired relaxation).  2. Right ventricular systolic function is normal. The right ventricular size is normal. There is mildly elevated pulmonary artery systolic pressure. The estimated right ventricular systolic pressure is 35.9 mmHg.  3. Left atrial size was mildly dilated.  4. The mitral valve is abnormal. Moderate mitral valve regurgitation. No evidence of mitral stenosis.  5. The aortic valve is normal in structure. Aortic valve regurgitation is not visualized. Mild to moderate aortic valve sclerosis/calcification is present, without any evidence of aortic stenosis.  6. The inferior vena cava is normal in size with greater than 50% respiratory variability, suggesting right atrial pressure of 3 mmHg. FINDINGS  Left Ventricle: Left ventricular ejection fraction, by estimation, is 40 to 45%. The left ventricle has mildly decreased function. The left ventricle demonstrates global  hypokinesis. The left ventricular internal cavity size was normal in size. There is  no left ventricular hypertrophy. Left ventricular diastolic parameters are consistent with Grade I diastolic dysfunction (impaired relaxation). Right Ventricle: The right ventricular size is normal. No increase in right ventricular wall thickness. Right ventricular systolic function is normal. There is mildly elevated pulmonary artery systolic pressure. The tricuspid regurgitant velocity is 2.78  m/s, and with an assumed right atrial pressure of 5 mmHg, the estimated right ventricular systolic pressure is 35.9 mmHg. Left Atrium: Left atrial size was mildly dilated. Right Atrium: Right atrial size was normal in size. Pericardium: There is no evidence of pericardial effusion. Mitral Valve: The mitral valve is abnormal. There is moderate thickening of the mitral valve leaflet(s). There is mild calcification of the mitral valve leaflet(s). Moderate mitral valve regurgitation. No evidence of mitral valve stenosis. Tricuspid Valve: The tricuspid valve is normal in structure. Tricuspid valve regurgitation is mild . No evidence of tricuspid stenosis. Aortic Valve: The aortic valve is normal in structure. Aortic valve regurgitation is not visualized. Mild to moderate aortic valve sclerosis/calcification is present, without any evidence of aortic stenosis. Aortic valve mean gradient measures 4.0 mmHg. Aortic valve peak gradient measures 6.5 mmHg. Aortic valve area, by VTI measures 2.47 cm. Pulmonic Valve: The pulmonic valve was normal in structure. Pulmonic valve regurgitation is not visualized. No evidence of pulmonic stenosis. Aorta: The aortic root is normal in size and structure. Venous: The inferior vena cava is normal in size with greater than 50% respiratory variability, suggesting right atrial pressure of 3 mmHg. IAS/Shunts: No atrial level shunt detected by color flow Doppler.  LEFT VENTRICLE PLAX 2D LVIDd:         4.51 cm       Diastology LVIDs:         3.78 cm      LV e' medial:    5.00 cm/s LV PW:         1.16 cm      LV E/e' medial:  13.5 LV IVS:  0.88 cm      LV e' lateral:   5.77 cm/s LVOT diam:     2.30 cm      LV E/e' lateral: 11.7 LV SV:         52 LV SV Index:   25 LVOT Area:     4.15 cm  LV Volumes (MOD) LV vol d, MOD A2C: 143.0 ml LV vol d, MOD A4C: 128.0 ml LV vol s, MOD A2C: 80.6 ml LV vol s, MOD A4C: 87.5 ml LV SV MOD A2C:     62.4 ml LV SV MOD A4C:     128.0 ml LV SV MOD BP:      57.9 ml RIGHT VENTRICLE RV Basal diam:  3.66 cm RV S prime:     12.00 cm/s TAPSE (M-mode): 3.9 cm LEFT ATRIUM           Index       RIGHT ATRIUM           Index LA diam:      3.20 cm 1.54 cm/m  RA Area:     14.80 cm LA Vol (A2C): 66.4 ml 31.97 ml/m RA Volume:   44.40 ml  21.38 ml/m LA Vol (A4C): 39.4 ml 18.97 ml/m  AORTIC VALVE                   PULMONIC VALVE AV Area (Vmax):    1.86 cm    PV Vmax:       0.58 m/s AV Area (Vmean):   1.93 cm    PV Peak grad:  1.4 mmHg AV Area (VTI):     2.47 cm AV Vmax:           127.00 cm/s AV Vmean:          89.300 cm/s AV VTI:            0.209 m AV Peak Grad:      6.5 mmHg AV Mean Grad:      4.0 mmHg LVOT Vmax:         56.80 cm/s LVOT Vmean:        41.500 cm/s LVOT VTI:          0.124 m LVOT/AV VTI ratio: 0.59  AORTA Ao Root diam: 3.40 cm MITRAL VALVE               TRICUSPID VALVE MV Area (PHT): 2.20 cm    TR Peak grad:   30.9 mmHg MV Decel Time: 345 msec    TR Vmax:        278.00 cm/s MV E velocity: 67.70 cm/s MV A velocity: 99.40 cm/s  SHUNTS MV E/A ratio:  0.68        Systemic VTI:  0.12 m                            Systemic Diam: 2.30 cm Lorine Bears MD Electronically signed by Lorine Bears MD Signature Date/Time: 06/17/2020/10:47:10 AM    Final         Scheduled Meds: .  stroke: mapping our early stages of recovery book   Does not apply Once  . amLODipine  5 mg Oral Daily  . aspirin EC  81 mg Oral Daily  . atorvastatin  10 mg Oral Daily  . calcium carbonate  1 tablet Oral TID WC   . feeding supplement  237 mL Oral TID BM  . heparin  5,000 Units  Subcutaneous Q8H  . losartan  25 mg Oral Daily  . [START ON 06/18/2020] multivitamin with minerals  1 tablet Oral Daily  . nicotine  14 mg Transdermal Daily  . sucralfate  1 g Oral TID WC & HS  . thiamine  100 mg Oral Daily  . vitamin B-12  1,000 mcg Oral Daily   Continuous Infusions:   Assessment & Plan:   Active Problems:   History of cocaine use   Tobacco use disorder   Syncope and collapse   Chest pain in adult   Cp- CTA negative for PE, CT revealed moderate coronary calcification Possibly her chest pain was due to cocaine causing vasospasm troponins are flat 11/5-Echo with EF 40 to 45% Cardiology was consulted, input was appreciated will need ischemic work-up as she has multiple cardiac risk factors and complaint of chest pain this a.m.  Will discuss with cardiology May be beneficial to do further work-up as inpatient as patient may not follow-up as outpatient since she has no insurance Continue with aspirin and statin which was started here  Losartan added for BP.  DC amlodipine  No beta-blockers as she does cooking  Syncope/collapse- etiology unclear.  Negative orthostatics.  CT head Chronic appearing left basal ganglia lacunar infarct Echo with EF of 40-45  Telemetry with PVCs  No arrhythmia found on telemetry  Etiology of this is unclear, CAD versus vasovagal versus arrhythmia from recent cocaine use  Carotid ultrasound without hemodynamically significant stenosis   Multiple thyroid nodules-incidental findings on CT scan  Needs follow-up ultrasound as outpatient    Tobacco abuse Was counseled Started on nicotine patch  Drug abuse- tox positive for cocaine and cannabinoid Counseled pt about stopping doing cocaine, risk/complications d/w her.  Accelerated HTN- needs improvement DC amlodipine start losartan shoulder with new cardiomyopathy Avoid beta blk due to cocaine use  DVT  prophylaxis: Heparin subcu Code Status: Full Family Communication: None at bedside  Status is: Observation  The patient remains OBS appropriate and will d/c before 2 midnights.  Dispo: The patient is from: Home              Anticipated d/c is to: Home              Anticipated d/c date is: 1 day              Patient currently is not medically stable to d/c. possible ischemic w/u as inpatient.         LOS: 0 days   Time spent: 35 min with >50% on coc    Lynn Ito, MD [facility] Pager [phone]  If 7PM-7AM, please contact night-coverage [email or web address] Password [email or web address] 06/17/2020, 2:28 PM

## 2020-06-17 NOTE — Progress Notes (Addendum)
SLP Cancellation Note  Patient Details Name: Christina Avery MRN: [id number] DOB: 25-Jun-1966   Cancelled treatment:        Chart reviewed, Pt visited. No apparent St needs at this time. Please re consult if we can be of further assistance.   Eather Colas 06/17/2020, 10:48 AM

## 2020-06-17 NOTE — Consult Note (Addendum)
Cardiology Consultation:   Patient ID: Christina Avery MRN: [id number]; DOB: [birth year]  Admit date: 06/15/2020 Date of Consult: 06/17/2020  Primary Care Provider: Patient, No Pcp Per [facility] Cardiologist: [facility], Dr. Kirke Corin rounding [facility] Electrophysiologist:  None    Patient Profile:   Christina Avery is a 54 y.o. 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 Marylu Lund.  History of Present Illness:   Christina Avery is a 54 year old female with PMH as above.  Prior to this admission, she denies having any personal history of heart disease or arrhythmia.  She also denies taking any PTA medications.  She reports that she does not have insurance, which will influence her which will influence her ability to take medications and follow-up.    She has family history of heart disease but is unable to provide further details.  She reports heart disease in her mother.  She reports current polysubstance use with last polysubstance use on Thursday, 06/13/2020.  At that time, she reports alcohol use/drinking 2 brandy's, cocaine use, and tobacco use.  She denies using since that time.  She does continue to smoke tobacco, reporting 1/4 pack of tobacco/cigarettes per day.  On 06/15/2020, she presented to [facility] emergency department after syncope and collapse.  She reports that she was at her friend's house, with when she started to feel warm and dizzy while on the couch.  She reports that the heat  was cranked up, which could have been contributing to her feelings.  She still states that a warm feeling came across her chest.  She denies any preceding radiating racing heart rate, palpitations, or chest pain.  She reports the warm feeling is different from chest pain.  Described more similar to diaphoresis. She did not feel short of breath.  She got up to leave her friend's house; however, she fell backwards when approaching the door and hit her head.  She tried to get up again, and fell a second time.  She fell again in the yard, so decided to sit on the porch for a while.  She then went to approach her car but fell backwards again, at which time a nearby neighbor attempted to help her.  After she fell again with the neighbor, he drove her home, at which time she had another fall.  She reports 5 episodes of syncope and collapse in total.  Prior to each episode, she did not feel any racing heart rate, palpitations, chest pain, or shortness of breath.  She reports that she has never had an episode like this before in the past.  In the emergency department, initial vitals significant for HR 80, BP 114/82, 100% on room air.  Labs showed hypokalemia with potassium 3.5, stable renal function with creatinine 0.91 and BUN 14, stable hemoglobin at 14.0 with hematocrit 41.7.  UA tox screen positive for cocaine and cannabinoids.  TSH borderline low at 0.371 with free T4 0.80.  D-dimer 530.99.  Hemoglobin A1c 4.7.  LDL 86.  Several imaging studies have been performed since  presenting to the ED.  CT head with chronic appearing left basal ganglia lacunar infarct but no acute findings.  CT spine with spondylosis and enlarged heterogeneous thyroid with multiple hypodense nodules with recommendation for thyroid ultrasound.  CXR without acute abnormality.  MRI brain without acute abnormality and progressive T2 signal abnormality in deep cerebral white brain matter, which was thought to may reflect progressive chronic small  vessel ischemic disease of progress, vasculitis, or hypercoagulable state with chronic lacunar infarct.  CTA chest  without pulmonary embolism and showing moderate coronary artery calcification, aortic atherosclerosis, and emphysema.  Carotid ultrasound with mild plaque in both carotid bulbs and proximal right ICA.  Estimated right ICA stenosis less than 50%.  No evidence of left ICA stenosis.  At the time of cardiac consultation, she continues to none any history of chest pain, racing heart rate, palpitations, lower extremity edema, abdominal distention, or early satiety.  She continues to feel as if her most recent episode was due to the heat in her friend's house.  We did have a long discussion regarding her polysubstance use, at which time she indicated desire to quit smoking.   Past Medical History:  Diagnosis Date  . Migraines     Past Surgical History:  Procedure Laterality Date  . CESAREAN SECTION    . CHOLECYSTECTOMY       Home Medications:  Prior to Admission medications   Medication Sig Start Date End Date Taking? Authorizing Provider  acetaminophen (TYLENOL) 500 MG tablet Take 500 mg by mouth every 6 (six) hours as needed.    [provider]  CALCIUM PO Take 1 tablet by mouth daily.    [provider]  ferrous sulfate 325 (65 FE) MG tablet Take 325 mg by mouth daily with breakfast.    [provider]  ibuprofen (ADVIL,MOTRIN) 200 MG tablet Take 200 mg by mouth every 6 (six) hours as needed.    [provider]  Multiple Vitamins-Minerals (MULTIVITAMIN WITH MINERALS) tablet Take 1 tablet by mouth daily.    [provider]  omeprazole (PRILOSEC) 40 MG capsule Take 40 mg by mouth daily.    [provider]  terbinafine (LAMISIL) 250 MG tablet Take 250 mg by mouth daily.    [provider]    Inpatient Medications: Scheduled Meds: .  stroke: mapping our early stages of recovery book   Does not apply Once  .  amLODipine  5 mg Oral Daily  . aspirin EC  81 mg Oral Daily  . atorvastatin  10 mg Oral Daily  . calcium carbonate  1 tablet Oral TID WC  . feeding supplement  237 mL Oral TID BM  . heparin  5,000 Units Subcutaneous Q8H  . [START ON 06/18/2020] multivitamin with minerals  1 tablet Oral Daily  . nicotine  14 mg Transdermal Daily  . sucralfate  1 g Oral TID WC & HS  . thiamine  100 mg Oral Daily  . vitamin B-12  1,000 mcg Oral Daily   Continuous Infusions: . lactated ringers 75 mL/hr at 06/17/20 0628   PRN Meds: acetaminophen **OR** acetaminophen (TYLENOL) oral liquid 160 mg/5 mL **OR** acetaminophen  Allergies:   No Known Allergies  Social History:   Social History   Socioeconomic History  . Marital status: Single    Spouse name: Not on file  . Number of children: Not on file  . Years of education: Not on file  . Highest education level: Not on file  Occupational History  .  Not on file  Tobacco Use  . Smoking status: Current Every Day Smoker    Packs/day: 0.50    Types: Cigarettes  . Smokeless tobacco: Never Used  Substance and Sexual Activity  . Alcohol use: Yes    Comment: occ  . Drug use: Yes    Types: Marijuana  . Sexual activity: Yes    Birth control/protection: None  Other Topics Concern  . Not on file  Social History Narrative  . Not on file   Social Determinants of Health   Financial Resource Strain:   . Difficulty of Paying Living Expenses: Not on file  Food Insecurity:   . Worried About Programme researcher, broadcasting/film/video in the Last Year: Not on file  . Ran Out of Food in the Last Year: Not on file  Transportation Needs:   . Lack of Transportation (Medical): Not on file  . Lack of Transportation (Non-Medical): Not on file  Physical Activity:   . Days of Exercise per Week: Not on file  . Minutes of Exercise per Session: Not on file  Stress:   . Feeling of Stress : Not on file  Social Connections:   . Frequency of Communication with Friends and Family: Not on  file  . Frequency of Social Gatherings with Friends and Family: Not on file  . Attends Religious Services: Not on file  . Active Member of Clubs or Organizations: Not on file  . Attends Banker Meetings: Not on file  . Marital Status: Not on file  Intimate Partner Violence:   . Fear of Current or Ex-Partner: Not on file  . Emotionally Abused: Not on file  . Physically Abused: Not on file  . Sexually Abused: Not on file    Family History:   History reviewed. No pertinent family history.  She reports family history of heart disease in her mother.  ROS:  Please see the history of present illness.  Review of Systems  Constitutional:       Reports feeling hot and sweaty prior to her episode of syncope.  Respiratory: Negative for cough, shortness of breath and wheezing.   Cardiovascular: Negative for chest pain, palpitations, orthopnea, leg swelling and PND.  Gastrointestinal: Negative for melena, nausea and vomiting.  Genitourinary: Negative for hematuria.  Musculoskeletal: Positive for falls.  Neurological: Positive for dizziness and loss of consciousness. Negative for focal weakness and weakness.  Psychiatric/Behavioral: Positive for substance abuse.       Reports recent alcohol, cocaine, marijuana, and tobacco use.  Last use of all substances on Thursday, 06/13/2020.  All other systems reviewed and are negative.   All other ROS reviewed and negative.     Physical Exam/Data:   Vitals:   06/17/20 0444 06/17/20 0740 06/17/20 0800 06/17/20 1221  BP: (!) 138/124 (!) 138/97  (!) 150/104  Pulse: (!) 52 (!) 47 (!) 50 67  Resp: 18 18  18   Temp: 98 F (36.7 C) 97.8 F (36.6 C)  98.4 F (36.9 C)  TempSrc:  Oral  Oral  SpO2: 93% 100%  100%  Weight:      Height:        Intake/Output Summary (Last 24 hours) at 06/17/2020 1241 Last data filed at 06/17/2020 1014 Gross per 24 hour  Intake 1472.1 ml  Output 300 ml  Net 1172.1 ml   Last 3 Weights 06/16/2020 06/15/2020  09/05/2019  Weight (lbs) 175 lb 11.3 oz 170 lb 200 lb  Weight (kg) 79.7 kg 77.111 kg 90.719 kg  Body mass index is 21.96 kg/m.  General:  Well nourished, well developed, in no acute distress.  Eating lunch. HEENT: normal Lymph: no adenopathy Neck: no JVD Endocrine:  No thryomegaly Vascular: No carotid bruits; FA pulses 2+ bilaterally without bruits  Cardiac:  normal S1, S2; RRR; 2/6 systolic murmur best appreciated at approximately fifth intercostal space. Lungs: Coarse breath sounds bilaterally Abd: soft, nontender, no hepatomegaly  Ext: no edema Musculoskeletal:  No deformities, BUE and BLE strength normal and equal Skin: warm and dry  Neuro:  CNs 2-12 intact, no focal abnormalities noted Psych:  Normal affect   EKG:  The EKG was personally reviewed and demonstrates: NSR, 75 bpm, consider left atrial enlargement, poor/delayed R wave progression, LVH, T wave abnormalities, prolonged QTc  Telemetry:  Telemetry was personally reviewed and demonstrates:  NSR with PACs and PVCs  Relevant CV Studies: Echo 06/17/20 1. Left ventricular ejection fraction, by estimation, is 40 to 45%. The  left ventricle has mildly decreased function. The left ventricle  demonstrates global hypokinesis. Left ventricular diastolic parameters are  consistent with Grade I diastolic  dysfunction (impaired relaxation).  2. Right ventricular systolic function is normal. The right ventricular  size is normal. There is mildly elevated pulmonary artery systolic  pressure. The estimated right ventricular systolic pressure is 35.9 mmHg.  3. Left atrial size was mildly dilated.  4. The mitral valve is abnormal. Moderate mitral valve regurgitation. No  evidence of mitral stenosis.  5. The aortic valve is normal in structure. Aortic valve regurgitation is  not visualized. Mild to moderate aortic valve sclerosis/calcification is  present, without any evidence of aortic stenosis.  6. The inferior vena cava  is normal in size with greater than 50%  respiratory variability, suggesting right atrial pressure of 3 mmHg.   Laboratory Data:  High Sensitivity Troponin:   Recent Labs  Lab 06/15/20 1718 06/15/20 1916  TROPONINIHS 6 9     Chemistry Recent Labs  Lab 06/15/20 1718  NA 136  K 3.5  CL 105  CO2 19*  GLUCOSE 105*  BUN 14  CREATININE 0.91  CALCIUM 9.1  GFRNONAA >60  ANIONGAP 12    Recent Labs  Lab 06/15/20 1916  PROT 7.5  ALBUMIN 3.8  AST 29  ALT 19  ALKPHOS 81  BILITOT 1.5*   Hematology Recent Labs  Lab 06/15/20 1718 06/15/20 2317  WBC 5.3  --   RBC 4.23 4.39  HGB 14.0  --   HCT 41.7  --   MCV 98.6  --   MCH 33.1  --   MCHC 33.6  --   RDW 11.6  --   PLT 219  --    BNPNo results for input(s): BNP, PROBNP in the last 168 hours.  DDimer No results for input(s): DDIMER in the last 168 hours.   Radiology/Studies:  DG Chest 2 View  Result Date: 06/15/2020 CLINICAL DATA:  Syncope EXAM: CHEST - 2 VIEW COMPARISON:  September 05, 2019 FINDINGS: The cardiomediastinal silhouette is unchanged in contour when accounting for patient rotation and differences in technique. No pleural effusion. No pneumothorax. No acute pleuroparenchymal abnormality. Visualized abdomen is unremarkable. Mild degenerative changes of the thoracic spine. IMPRESSION: No acute cardiopulmonary abnormality. Electronically Signed   By: Meda Klinefelter MD   On: 06/15/2020 18:58   CT Head Wo Contrast  Result Date: 06/15/2020 CLINICAL DATA:  Recurrent syncope EXAM: CT HEAD WITHOUT CONTRAST TECHNIQUE: Contiguous axial images were obtained from the base of the skull through the  vertex without intravenous contrast. COMPARISON:  08/27/2016. FINDINGS: Brain: Focal hypodensity within the left basal ganglia is new since previous exam, but has an appearance consistent with chronic lacunar infarct. No signs of acute infarct or hemorrhage. Lateral ventricles and remaining midline structures are unremarkable.  No acute extra-axial fluid collections. No mass effect. Vascular: No hyperdense vessel or unexpected calcification. Skull: Normal. Negative for fracture or focal lesion. Sinuses/Orbits: Left maxillary sinus mucosal thickening. Remaining sinuses are clear. Other: None. IMPRESSION: 1. Chronic appearing left basal ganglia lacunar infarct. 2. No acute intracranial process. Electronically Signed   By: Sharlet Salina M.D.   On: 06/15/2020 18:07   CT ANGIO CHEST PE W OR WO CONTRAST  Result Date: 06/15/2020 CLINICAL DATA:  Respiratory failure, syncope EXAM: CT ANGIOGRAPHY CHEST WITH CONTRAST TECHNIQUE: Multidetector CT imaging of the chest was performed using the standard protocol during bolus administration of intravenous contrast. Multiplanar CT image reconstructions and MIPs were obtained to evaluate the vascular anatomy. CONTRAST:  65mL OMNIPAQUE IOHEXOL 350 MG/ML SOLN COMPARISON:  None. FINDINGS: Cardiovascular: There is adequate opacification of the pulmonary arterial tree. No intraluminal filling defect identified to suggest acute pulmonary embolism. Central pulmonary arteries are of normal caliber. Mild coronary artery calcification predominantly within the mid left anterior descending coronary artery. Global cardiac size is within normal limits. No pericardial effusion. No aortic aneurysm. Mild atherosclerotic calcification within the thoracic aorta. Mediastinum/Nodes: 11 mm nodule within the right thyroid lobe. Further follow-up is not required given its size and the patient's age. No pathologic adenopathy within the thorax. Esophagus unremarkable. Lungs/Pleura: Mild paraseptal emphysema. Mild bibasilar atelectasis, right greater than left. No superimposed focal pulmonary infiltrate. No pneumothorax or pleural effusion. No central obstructing mass. Upper Abdomen: No acute abnormality. Musculoskeletal: No acute bone abnormality. Review of the MIP images confirms the above findings. IMPRESSION: No pulmonary  embolism. Moderate coronary artery calcification. Aortic Atherosclerosis (ICD10-I70.0) and Emphysema (ICD10-J43.9). Electronically Signed   By: Helyn Numbers MD   On: 06/15/2020 22:32   CT Cervical Spine Wo Contrast  Result Date: 06/15/2020 CLINICAL DATA:  Syncope EXAM: CT CERVICAL SPINE WITHOUT CONTRAST TECHNIQUE: Multidetector CT imaging of the cervical spine was performed without intravenous contrast. Multiplanar CT image reconstructions were also generated. COMPARISON:  None. FINDINGS: Alignment: Alignment is anatomic. Skull base and vertebrae: No acute fracture. No primary bone lesion or focal pathologic process. Soft tissues and spinal canal: Thyroid is heterogeneous and enlarged, with multiple hypodense nodules throughout the right lobe largest measuring 1.4 cm. No prevertebral soft tissue swelling. No evidence of canal hematoma. Disc levels: Moderate spondylosis at C4-5 and C5-6. Left predominant neural foraminal narrowing at C4-5. Upper chest: Airway is patent. Lung apices demonstrate bullous emphysematous changes. Other: Reconstructed images demonstrate no additional findings. IMPRESSION: 1. Mild cervical spondylosis.  No acute fracture. 2. Enlarged heterogeneous thyroid with multiple hypodense nodules. Recommend thyroid ultrasound (ref: J Am Coll Radiol. 2015 Feb;12(2): 143-50). Electronically Signed   By: Sharlet Salina M.D.   On: 06/15/2020 18:11   MR BRAIN WO CONTRAST  Result Date: 06/15/2020 CLINICAL DATA:  Recurrent syncope.  Multiple falls. EXAM: MRI HEAD WITHOUT CONTRAST TECHNIQUE: Multiplanar, multiecho pulse sequences of the brain and surrounding structures were obtained without intravenous contrast. COMPARISON:  Head CT 06/15/2020 and MRI 08/27/2016 FINDINGS: Brain: There is no evidence of an acute infarct, intracranial hemorrhage, mass, midline shift, or extra-axial fluid collection. The ventricles and sulci are normal. There is patchy T2 hyperintensity in the deep cerebral white  matter bilaterally, basal ganglia, thalami  and pons which has greatly progressed from 2018, and there is a new discrete chronic lacunar infarct in the genu of the left internal capsule. A partially empty sella is unchanged. Vascular: Major intracranial vascular flow voids are preserved. Skull and upper cervical spine: Major intracranial vascular flow voids are preserved. Sinuses/Orbits: Unremarkable orbits. Mild circumferential mucosal thickening in the left maxillary sinus. Clear mastoid air cells. Other: None. IMPRESSION: 1. No acute intracranial abnormality. 2. Progressive T2 signal abnormality in the deep cerebral white matter, deep gray nuclei, and pons since 2018 with an interval chronic lacunar infarct in the left internal capsule. This may reflect progressive chronic small vessel ischemic disease, vasculitis, or hypercoagulable state. Electronically Signed   By: Sebastian Ache M.D.   On: 06/15/2020 21:49   US Carotid Bilateral (at [facility] and [facility] only)  Result Date: 06/16/2020 CLINICAL DATA:  Syncope and hypertension. EXAM: BILATERAL CAROTID DUPLEX ULTRASOUND TECHNIQUE: Wallace Cullens scale imaging, color Doppler and duplex ultrasound were performed of bilateral carotid and vertebral arteries in the neck. COMPARISON:  None. FINDINGS: Criteria: Quantification of carotid stenosis is based on velocity parameters that correlate the residual internal carotid diameter with NASCET-based stenosis levels, using the diameter of the distal internal carotid lumen as the denominator for stenosis measurement. The following velocity measurements were obtained: RIGHT ICA:  70/27 cm/sec CCA:  79/24 cm/sec SYSTOLIC ICA/CCA RATIO:  0.9 ECA:  49 cm/sec LEFT ICA:  94/38 cm/sec CCA:  59/16 cm/sec SYSTOLIC ICA/CCA RATIO:  1.6 ECA:  59 cm/sec RIGHT CAROTID ARTERY: Mild calcified plaque is present at the level of the distal bulb and proximal right ICA. Estimated right ICA stenosis is less than 50%. RIGHT VERTEBRAL ARTERY: Antegrade flow with  normal waveform and velocity. LEFT CAROTID ARTERY: Minimal plaque in the carotid bulb. No evidence of left ICA plaque or stenosis. LEFT VERTEBRAL ARTERY: Antegrade flow with normal waveform and velocity. IMPRESSION: Mild plaque in both carotid bulbs and the proximal right ICA. Estimated right ICA stenosis is less than 50%. There is no evidence of left ICA stenosis. Electronically Signed   By: Irish Lack M.D.   On: 06/16/2020 11:03   ECHOCARDIOGRAM COMPLETE  Result Date: 06/17/2020    ECHOCARDIOGRAM REPORT   Patient Name:   NALONI PORTES Date of Exam: 06/17/2020 Medical Rec #:  [id number]        Height:       75.0 in Accession #:    [id number]       Weight:       175.7 lb Date of Birth:  1965/08/12        BSA:          2.077 m Patient Age:    54 years         BP:           138/97 mmHg Patient Gender: F                HR:           50 bpm. Exam Location:  [facility] Procedure: 2D Echo, Cardiac Doppler and Color Doppler Indications:     Syncope 780.2  History:         Patient has no prior history of Echocardiogram examinations.                  Migraines.  Sonographer:     Cristela Blue RDCS (AE) Referring Phys:  [id number] Eliezer Mccoy PATEL Diagnosing Phys: Lorine Bears MD IMPRESSIONS  1. Left ventricular ejection fraction,  by estimation, is 40 to 45%. The left ventricle has mildly decreased function. The left ventricle demonstrates global hypokinesis. Left ventricular diastolic parameters are consistent with Grade I diastolic dysfunction (impaired relaxation).  2. Right ventricular systolic function is normal. The right ventricular size is normal. There is mildly elevated pulmonary artery systolic pressure. The estimated right ventricular systolic pressure is 35.9 mmHg.  3. Left atrial size was mildly dilated.  4. The mitral valve is abnormal. Moderate mitral valve regurgitation. No evidence of mitral stenosis.  5. The aortic valve is normal in structure. Aortic valve regurgitation is not visualized. Mild to moderate  aortic valve sclerosis/calcification is present, without any evidence of aortic stenosis.  6. The inferior vena cava is normal in size with greater than 50% respiratory variability, suggesting right atrial pressure of 3 mmHg. FINDINGS  Left Ventricle: Left ventricular ejection fraction, by estimation, is 40 to 45%. The left ventricle has mildly decreased function. The left ventricle demonstrates global hypokinesis. The left ventricular internal cavity size was normal in size. There is  no left ventricular hypertrophy. Left ventricular diastolic parameters are consistent with Grade I diastolic dysfunction (impaired relaxation). Right Ventricle: The right ventricular size is normal. No increase in right ventricular wall thickness. Right ventricular systolic function is normal. There is mildly elevated pulmonary artery systolic pressure. The tricuspid regurgitant velocity is 2.78  m/s, and with an assumed right atrial pressure of 5 mmHg, the estimated right ventricular systolic pressure is 35.9 mmHg. Left Atrium: Left atrial size was mildly dilated. Right Atrium: Right atrial size was normal in size. Pericardium: There is no evidence of pericardial effusion. Mitral Valve: The mitral valve is abnormal. There is moderate thickening of the mitral valve leaflet(s). There is mild calcification of the mitral valve leaflet(s). Moderate mitral valve regurgitation. No evidence of mitral valve stenosis. Tricuspid Valve: The tricuspid valve is normal in structure. Tricuspid valve regurgitation is mild . No evidence of tricuspid stenosis. Aortic Valve: The aortic valve is normal in structure. Aortic valve regurgitation is not visualized. Mild to moderate aortic valve sclerosis/calcification is present, without any evidence of aortic stenosis. Aortic valve mean gradient measures 4.0 mmHg. Aortic valve peak gradient measures 6.5 mmHg. Aortic valve area, by VTI measures 2.47 cm. Pulmonic Valve: The pulmonic valve was normal in  structure. Pulmonic valve regurgitation is not visualized. No evidence of pulmonic stenosis. Aorta: The aortic root is normal in size and structure. Venous: The inferior vena cava is normal in size with greater than 50% respiratory variability, suggesting right atrial pressure of 3 mmHg. IAS/Shunts: No atrial level shunt detected by color flow Doppler.  LEFT VENTRICLE PLAX 2D LVIDd:         4.51 cm      Diastology LVIDs:         3.78 cm      LV e' medial:    5.00 cm/s LV PW:         1.16 cm      LV E/e' medial:  13.5 LV IVS:        0.88 cm      LV e' lateral:   5.77 cm/s LVOT diam:     2.30 cm      LV E/e' lateral: 11.7 LV SV:         52 LV SV Index:   25 LVOT Area:     4.15 cm  LV Volumes (MOD) LV vol d, MOD A2C: 143.0 ml LV vol d, MOD A4C: 128.0 ml LV vol  s, MOD A2C: 80.6 ml LV vol s, MOD A4C: 87.5 ml LV SV MOD A2C:     62.4 ml LV SV MOD A4C:     128.0 ml LV SV MOD BP:      57.9 ml RIGHT VENTRICLE RV Basal diam:  3.66 cm RV S prime:     12.00 cm/s TAPSE (M-mode): 3.9 cm LEFT ATRIUM           Index       RIGHT ATRIUM           Index LA diam:      3.20 cm 1.54 cm/m  RA Area:     14.80 cm LA Vol (A2C): 66.4 ml 31.97 ml/m RA Volume:   44.40 ml  21.38 ml/m LA Vol (A4C): 39.4 ml 18.97 ml/m  AORTIC VALVE                   PULMONIC VALVE AV Area (Vmax):    1.86 cm    PV Vmax:       0.58 m/s AV Area (Vmean):   1.93 cm    PV Peak grad:  1.4 mmHg AV Area (VTI):     2.47 cm AV Vmax:           127.00 cm/s AV Vmean:          89.300 cm/s AV VTI:            0.209 m AV Peak Grad:      6.5 mmHg AV Mean Grad:      4.0 mmHg LVOT Vmax:         56.80 cm/s LVOT Vmean:        41.500 cm/s LVOT VTI:          0.124 m LVOT/AV VTI ratio: 0.59  AORTA Ao Root diam: 3.40 cm MITRAL VALVE               TRICUSPID VALVE MV Area (PHT): 2.20 cm    TR Peak grad:   30.9 mmHg MV Decel Time: 345 msec    TR Vmax:        278.00 cm/s MV E velocity: 67.70 cm/s MV A velocity: 99.40 cm/s  SHUNTS MV E/A ratio:  0.68        Systemic VTI:  0.12 m                             Systemic Diam: 2.30 cm Lorine Bears MD Electronically signed by Lorine Bears MD Signature Date/Time: 06/17/2020/10:47:10 AM    Final      Assessment and Plan:   1. Syncope and collapse  Reports 5 episodes of syncope and collapse.  She denies experiencing episodes like this before in the past.  Considered is syncope 2/2 vasovagal etiology given warm in the room, as well as arrhythmia in the setting of polysubstance use.  CTA negative for pulmonary embolism. Bilateral carotid ultrasound shows RICA stenosis less than 50%. Imaging of her head without acute findings / acute CVA and noting chronic lacunar infarct and possible findings consistent with small vessel ischemic disease.  Continue to monitor electrolytes. K goal 4.0. Mg goal 2.0.  TSH borderline low with thyroid nodules appreciated on imaging (see below).  Telemetry significant for NSR with PACs.  No evidence of arrhythmia yet on telemetry.  Echo with reduced EF 40 to 45%.  High-sensitivity troponin negative.  No acute ST/T changes on EKG.  She  does have several risk factors for cardiac etiology, including tobacco/polysubstance use, family history, and CAC by CT.   Given reduced EF, recommend further ischemic work-up this admission or as an outpatient. Can likely be worked up in the outpatient setting.  Discontinued fluids given reduced EF.  Continue medical management / risk factor modification. Given cocaine use, as well as current bradycardia, recommend against selective beta-blockers to avoid unopposed alpha.  Given reduced EF, avoid diltiazem / non-dihydropyridine CCB and IVF. Discontinued fluids. Complete cessation of polysubstance use as below recommended.  Recommend statin for risk factor modification with ASA and statin.   Started losartan  daily given reduced EF and ordered repeat BMET. Escalate GDMT as tolerated.   Recommend case management to assist with barriers to care.  2. Chronic combined  HFrEF, pulmonary HTN  Denies shortness of breath.  Echo with EF 40 to 45%, LV global hypokinesis, G1 DD, RVSP 35.9 mmHg, mild LAE, moderate MR, aortic sclerosis without stenosis.  At this time, unclear if nonischemic or ischemic etiology.    Given reduced EF, as well as CAC by CT, further ischemic work-up recommended either this admission or as an outpatient.   Given ectopy on telemetry and syncope, consider discharge with Zio monitor as well to assess burden of ectopy and rule out tachycardia mediated etiology.  Monitor volume status. Currently euvolemic on exam.   Discontinued fluids. Caution with IVF given reduced EF.   Monitor I/os. Daily weights.  Daily BMET. Ordered BMET.  CHF education.  Ideally, follow-up in the office.  Case management for barriers to care.   Started low dose losartan  daily with repeat BMET placed by myself.   Recommend escalate GDMT as tolerated. Continue medical management.  Caution with selective BB given cocaine use and non-dihydropyridine CCB given reduced EF as above. Caution with IVF - discontinued fluids today. Aggressive risk factor modification.  Recommend complete polysubstance cessation.   3. Hypokalemia  Repeat BMET ordered.  Replete potassium with goal 4.0.    Check Mg. Goal 2.0.  4. Polysubstance use: alcohol, cocaine, tobacco, marijuana  Reports polysubstance use. Including cocaine, tobacco, alcohol, and marijuana.   Most recent self reported use 06/13/2020.    UA positive for both cocaine and marijuana.  Given cocaine use, caution with selective beta-blockers.  Recommend complete cessation.  Continue nicotine patch.  5. Moderate CAC by CT  Coronary artery calcium seen on most recent imaging.  Given reduced EF, further ischemic work-up recommended this admission or as an outpatient.  Aggressive risk factor modification.  Continue medical management, including ASA and statin. Started losartan with repeat BMET  ordered.  6. Moderate MR  Moderate MR by recent echo.  HR/BP control. Continue to monitor with annual echo. Started losartan for further BP control.  7. PACs, PVCs  Consider Zio monitoring at discharge to quantify the burden of PACs and PVCs, as well as rule out arrhythmia.  8. Carotid dz  Most recent ultrasound shows RICA stenosis less than 50%.  Continue to monitor.  Aggressive risk factor modification with ASA and statin recommended.  9. HTN  Current BP suboptimal.  Currently on amlodipine 5 mg daily.  If BP continues to be elevated, increase to amlodipine 10 mg daily.  Caution with selective beta-blockers given bradycardic rate and history of cocaine use/to avoid unopposed alpha.  Would not recommend diltiazem / non-dihydropyridine CCB given reduced EF. Started losartan for further BP control and with repeat BMET ordered.  10. HLD  Recommend ASA and statin with  LDL goal less than 70 given CAC and carotid dz. currently on atorvastatin 10 mg daily.  Recommend escalation of statin as needed for LDL control.  11. Thyroid nodules  Follow-up imaging recommended as above in imaging report.  Per IM.  12. Barriers to care  Consider case management consultation.  States today that she will be unable to follow-up in the office and take recommended medications 2/2 lack of insurance.  We will need to ensure she is able to follow-up in the office and take medications as indicated.        New York Heart Association (NYHA) Functional Class NYHA Class I        For questions or updates, please contact [facility] Please consult [email or web address] for contact info under    Signed, Lennon Alstrom, PA-C  06/17/2020 12:41 PM

## 2020-06-17 NOTE — Progress Notes (Signed)
Initial Nutrition Assessment  DOCUMENTATION CODES:   Not applicable  INTERVENTION:   Ensure Enlive po TID, each supplement provides 350 kcal and 20 grams of protein  MVI daily   B12 tablet- 1058mg po daily   Liberalize diet   Pt at high refeed risk; recommend monitor potassium, magnesium and phosphorus labs daily until stable  NUTRITION DIAGNOSIS:   Inadequate oral intake related to social / environmental circumstances (substance abuse) as evidenced by per patient/family report.  GOAL:   Patient will meet greater than or equal to 90% of their needs  MONITOR:   PO intake, Supplement acceptance, Labs, Weight trends, Skin, I & O's  REASON FOR ASSESSMENT:   Malnutrition Screening Tool    ASSESSMENT:   54y/o female with h/o HTN and substance abuse who is admitted with syncope   Met with pt in room today. Pt reports fair appetite and oral intake at baseline but reports her appetite has been decreased pta r/t substance abuse. Pt also reports weight loss of ~20lbs over the past 6 months. Pt reports that her appetite is fair in hospital; pt ate 50% of her breakfast this morning. Pt's main complaint is the hospital food; pt reports that she cannot eat food without salt. RD discussed with pt the importance of adequate nutrition needed to preserve lean muscle. Pt reluctantly agrees to try Ensure supplements. RD will add supplements and MVI to help pt meet her estimated needs. RD will also liberalize the heart healthy portion of pt's diet as this is restrictive of protein. Pt is likely at refeed risk. Per chart, pt is down 24lbs(12%) over the past 9 months; this is not significant. Of note, pt with B12 deficiency; will add supplementation.   Medications reviewed and include: aspirin, tums, heparin, nicotine, carafate, thiamine, LRS '@75ml' /hr  Labs reviewed: P 3.7 wnl- 11/14 B12 102(L)- 11/13  NUTRITION - FOCUSED PHYSICAL EXAM:    Most Recent Value  Orbital Region No depletion   Upper Arm Region No depletion  Thoracic and Lumbar Region No depletion  Buccal Region No depletion  Temple Region No depletion  Clavicle Bone Region Moderate depletion  Clavicle and Acromion Bone Region Moderate depletion  Scapular Bone Region No depletion  Dorsal Hand Mild depletion  Patellar Region Moderate depletion  Anterior Thigh Region Moderate depletion  Posterior Calf Region Moderate depletion  Edema (RD Assessment) None  Hair Reviewed  Eyes Reviewed  Mouth Reviewed  Skin Reviewed  Nails Reviewed     Diet Order:   Diet Order            Diet 2 gram sodium Room service appropriate? Yes; Fluid consistency: Thin  Diet effective now                EDUCATION NEEDS:   Education needs have been addressed  Skin:  Skin Assessment: Reviewed RN Assessment  Last BM:  11/14  Height:   Ht Readings from Last 1 Encounters:  06/15/20 '6\' 3"'  (1.905 m)    Weight:   Wt Readings from Last 1 Encounters:  06/16/20 79.7 kg    Ideal Body Weight:  79.5 kg  BMI:  Body mass index is 21.96 kg/m.  Estimated Nutritional Needs:   Kcal:  2100-2400kcal/day  Protein:  105-120g/day  Fluid:  2.3-2.7L/day  CKoleen DistanceMS, RD, LDN Please refer to [facility]for RD and/or RD on-call/weekend/after hours pager

## 2020-06-17 NOTE — Progress Notes (Signed)
*  PRELIMINARY RESULTS* Echocardiogram 2D Echocardiogram has been performed.  Cristela Blue 06/17/2020, 9:15 AM

## 2020-06-17 NOTE — TOC Progression Note (Signed)
Transition of Care [facility]) - Progression Note    Patient Details  Name: Christina Avery MRN: [id number] Date of Birth: 14-Mar-1966  [facility] CM/SW Contact  Liliana Cline, LCSW Phone Number: 06/17/2020, 2:45 PM  Clinical Narrative:   Informed by MD that patient is uninsured and needs help with PCP/meds. TOC Eleanor provided [facility]/Medication Management application over the weekend. Discharge medications can be prescribed to Medication Management. Updated pharmacy in chart.         Expected Discharge Plan and Services                                                 Social Determinants of Health (SDOH) Interventions    Readmission Risk Interventions No flowsheet data found.

## 2020-06-17 NOTE — Plan of Care (Signed)
  Problem: Education: Goal: Knowledge of General Education information will improve Description: Including pain rating scale, medication(s)/side effects and non-pharmacologic comfort measures Outcome: Progressing   Problem: Health Behavior/Discharge Planning: Goal: Ability to manage health-related needs will improve Outcome: Progressing   Problem: Clinical Measurements: Goal: Ability to maintain clinical measurements within normal limits will improve Outcome: Progressing Goal: Will remain free from infection Outcome: Progressing Goal: Diagnostic test results will improve Outcome: Progressing Goal: Respiratory complications will improve Outcome: Progressing Goal: Cardiovascular complication will be avoided Outcome: Progressing   Problem: Activity: Goal: Risk for activity intolerance will decrease Outcome: Progressing   Problem: Nutrition: Goal: Adequate nutrition will be maintained Outcome: Progressing   Problem: Coping: Goal: Level of anxiety will decrease Outcome: Progressing   Problem: Elimination: Goal: Will not experience complications related to bowel motility Outcome: Progressing Goal: Will not experience complications related to urinary retention Outcome: Progressing   Problem: Pain Managment: Goal: General experience of comfort will improve Outcome: Progressing   Problem: Safety: Goal: Ability to remain free from injury will improve Outcome: Progressing   Problem: Skin Integrity: Goal: Risk for impaired skin integrity will decrease Outcome: Progressing   Problem: Education: Goal: Knowledge of secondary prevention will improve Outcome: Progressing Goal: Knowledge of patient specific risk factors addressed and post discharge goals established will improve Outcome: Progressing   Problem: Coping: Goal: Will verbalize positive feelings about self Outcome: Progressing Goal: Will identify appropriate support needs Outcome: Progressing   Problem:  Self-Care: Goal: Ability to participate in self-care as condition permits will improve Outcome: Progressing   Problem: Nutrition: Goal: Risk of aspiration will decrease Outcome: Progressing   Problem: Ischemic Stroke/TIA Tissue Perfusion: Goal: Complications of ischemic stroke/TIA will be minimized Outcome: Progressing   Problem: Spontaneous Subarachnoid Hemorrhage Tissue Perfusion: Goal: Complications of Spontaneous Subarachnoid Hemorrhage will be minimized Outcome: Progressing

## 2020-06-18 ENCOUNTER — Other Ambulatory Visit: Payer: Self-pay | Admitting: Internal Medicine

## 2020-06-18 MED ORDER — ATORVASTATIN CALCIUM 10 MG PO TABS: 10.0000 mg | ORAL_TABLET | Freq: Every day | ORAL | 0 refills | Status: DC

## 2020-06-18 MED ORDER — LOSARTAN POTASSIUM 25 MG PO TABS: 25.0000 mg | ORAL_TABLET | Freq: Every day | ORAL | 0 refills | Status: DC

## 2020-06-18 MED ORDER — ASPIRIN 81 MG PO TBEC
81.0000 mg | DELAYED_RELEASE_TABLET | Freq: Every day | ORAL | 0 refills | Status: DC
Start: 2020-06-18 — End: 2020-09-17

## 2020-06-18 MED ORDER — CARVEDILOL 3.125 MG PO TABS: 3.1250 mg | ORAL_TABLET | Freq: Two times a day (BID) | ORAL | 0 refills | Status: DC

## 2020-06-18 MED ORDER — CYANOCOBALAMIN 1000 MCG PO TABS: 1000.0000 ug | ORAL_TABLET | Freq: Every day | ORAL | 0 refills | Status: AC

## 2020-06-18 MED ORDER — THIAMINE HCL 100 MG PO TABS: 100.0000 mg | ORAL_TABLET | Freq: Every day | ORAL | 1 refills | Status: AC

## 2020-06-18 NOTE — Discharge Summary (Signed)
Christina Avery:[id number] DOB: [birth year] DOA: 06/15/2020  PCP: Patient, No Pcp Per  Admit date: 06/15/2020 Discharge date: 06/18/2020  Admitted From: Home Disposition: Home  Recommendations for Outpatient Follow-up:  1. Follow up with PCP in 1 week 2. Please obtain BMP/CBC in one week 3. Cardiology in 1 week     Discharge Condition:Stable CODE STATUS: Full Diet recommendation: Heart Healthy  Brief/Interim Summary: Per admission H&P: Christina Avery is a 54 y.o. female with medical history significant of  htn , pt went to visit her friend and fell backwards and hit her head and back, friend held her and she came and to then fell again then she woke up again.she sat on yard and walked to car. Then she drove to her sisters home and there she fell again, she got up and fell and friend up to the house. Pt reports left sided chest pain, pt has been taking BC powder.  Patient drinks and smoke.  Toxicology was positive for cocaine and cannabinoids  Cp- Patient was placed on telemetry.  She only had PVCs and PACs. She had an echocardiogram revealing EF of 40 to 45%. 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  They did not think she requires any further ischemic evaluation at this point.  She was started on carvedilol and losartan.  Patient was counseled on stopping tobacco abuse, cocaine use and alcohol abuse. She will need to follow-up cardiology as outpatient regarding her cardiomyopathy. Also she was counseled on medication compliance.  She was given paperwork for application to the free clinic twice during this hospitalization.  Syncope/collapse- etiology unclear.  Possibly vasovagal in nature in the setting of polysubstance abuse.  No evidence of arrhythmia on telemetry. Negative orthostatics.  CT head Chronic appearing left basal ganglia lacunar infarct Echo with EF of 40-45  Carotid ultrasound without hemodynamically significant  stenosis   Multiple thyroid nodules-incidental findings on CT scan  Needs follow-up ultrasound as outpatient    Tobacco abuse Smoking cessation was discussed with the patient  Drug abuse- tox positive for cocaine and cannabinoid Counseled pt about stopping doing cocaine, risk/complications d/w her.  Accelerated HTN- needs improvement Stable.  Continue with Coreg and losartan Follow-up with PCP for further management   Discharge Diagnoses:  Active Problems:   History of cocaine use   Tobacco use disorder   Syncope and collapse   Chest pain in adult    Discharge Instructions  Discharge Instructions    Call MD for:  difficulty breathing, headache or visual disturbances   Complete by: As directed    Diet - low sodium heart healthy   Complete by: As directed    Discharge instructions   Complete by: As directed    Stop using drugs and alcohol F/u with cardiology and primary care Take medications   Increase activity slowly   Complete by: As directed      Allergies as of 06/18/2020   No Known Allergies     Medication List    STOP taking these medications   acetaminophen 500 MG tablet Commonly known as: TYLENOL   ibuprofen 200 MG tablet Commonly known as: ADVIL     TAKE these medications   aspirin 81 MG EC tablet Take 1 tablet (81 mg total) by mouth daily. Swallow whole.   atorvastatin 10 MG tablet Commonly known as: LIPITOR Take 1 tablet (10 mg total) by mouth daily.   CALCIUM PO Take 1 tablet by mouth daily.   carvedilol  3.125 MG tablet Commonly known as: COREG Take 1 tablet (3.125 mg total) by mouth 2 (two) times daily with a meal.   cyanocobalamin 1000 MCG tablet Take 1 tablet (1,000 mcg total) by mouth daily.   ferrous sulfate 325 (65 FE) MG tablet Take 325 mg by mouth daily with breakfast.   losartan 25 MG tablet Commonly known as: COZAAR Take 1 tablet (25 mg total) by mouth daily.   multivitamin with minerals tablet Take 1 tablet  by mouth daily.   omeprazole 40 MG capsule Commonly known as: PRILOSEC Take 40 mg by mouth daily.   terbinafine 250 MG tablet Commonly known as: LAMISIL Take 250 mg by mouth daily.   thiamine 100 MG tablet Take 1 tablet (100 mg total) by mouth daily.       Follow-up Information    Iran Ouch, MD Follow up in 1 week(s).   Specialty: Cardiology Contact information: [address] [place] [address] [phone]        [facility] Follow up in 1 week(s).   Contact information: [address] [place] [address] [phone]             No Known Allergies  Consultations:  Cardiology   Procedures/Studies: DG Chest 2 View  Result Date: 06/15/2020 CLINICAL DATA:  Syncope EXAM: CHEST - 2 VIEW COMPARISON:  September 05, 2019 FINDINGS: The cardiomediastinal silhouette is unchanged in contour when accounting for patient rotation and differences in technique. No pleural effusion. No pneumothorax. No acute pleuroparenchymal abnormality. Visualized abdomen is unremarkable. Mild degenerative changes of the thoracic spine. IMPRESSION: No acute cardiopulmonary abnormality. Electronically Signed   By: Meda Klinefelter MD   On: 06/15/2020 18:58   CT Head Wo Contrast  Result Date: 06/15/2020 CLINICAL DATA:  Recurrent syncope EXAM: CT HEAD WITHOUT CONTRAST TECHNIQUE: Contiguous axial images were obtained from the base of the skull through the vertex without intravenous contrast. COMPARISON:  08/27/2016. FINDINGS: Brain: Focal hypodensity within the left basal ganglia is new since previous exam, but has an appearance consistent with chronic lacunar infarct. No signs of acute infarct or hemorrhage. Lateral ventricles and remaining midline structures are unremarkable. No acute extra-axial fluid collections. No mass effect. Vascular: No hyperdense vessel or unexpected calcification. Skull: Normal. Negative for fracture or focal  lesion. Sinuses/Orbits: Left maxillary sinus mucosal thickening. Remaining sinuses are clear. Other: None. IMPRESSION: 1. Chronic appearing left basal ganglia lacunar infarct. 2. No acute intracranial process. Electronically Signed   By: Sharlet Salina M.D.   On: 06/15/2020 18:07   CT ANGIO CHEST PE W OR WO CONTRAST  Result Date: 06/15/2020 CLINICAL DATA:  Respiratory failure, syncope EXAM: CT ANGIOGRAPHY CHEST WITH CONTRAST TECHNIQUE: Multidetector CT imaging of the chest was performed using the standard protocol during bolus administration of intravenous contrast. Multiplanar CT image reconstructions and MIPs were obtained to evaluate the vascular anatomy. CONTRAST:  75mL OMNIPAQUE IOHEXOL 350 MG/ML SOLN COMPARISON:  None. FINDINGS: Cardiovascular: There is adequate opacification of the pulmonary arterial tree. No intraluminal filling defect identified to suggest acute pulmonary embolism. Central pulmonary arteries are of normal caliber. Mild coronary artery calcification predominantly within the mid left anterior descending coronary artery. Global cardiac size is within normal limits. No pericardial effusion. No aortic aneurysm. Mild atherosclerotic calcification within the thoracic aorta. Mediastinum/Nodes: 11 mm nodule within the right thyroid lobe. Further follow-up is not required given its size and the patient's age. No pathologic adenopathy within the thorax.  Esophagus unremarkable. Lungs/Pleura: Mild paraseptal emphysema. Mild bibasilar atelectasis, right greater than left. No superimposed focal pulmonary infiltrate. No pneumothorax or pleural effusion. No central obstructing mass. Upper Abdomen: No acute abnormality. Musculoskeletal: No acute bone abnormality. Review of the MIP images confirms the above findings. IMPRESSION: No pulmonary embolism. Moderate coronary artery calcification. Aortic Atherosclerosis (ICD10-I70.0) and Emphysema (ICD10-J43.9). Electronically Signed   By: Helyn Numbers MD    On: 06/15/2020 22:32   CT Cervical Spine Wo Contrast  Result Date: 06/15/2020 CLINICAL DATA:  Syncope EXAM: CT CERVICAL SPINE WITHOUT CONTRAST TECHNIQUE: Multidetector CT imaging of the cervical spine was performed without intravenous contrast. Multiplanar CT image reconstructions were also generated. COMPARISON:  None. FINDINGS: Alignment: Alignment is anatomic. Skull base and vertebrae: No acute fracture. No primary bone lesion or focal pathologic process. Soft tissues and spinal canal: Thyroid is heterogeneous and enlarged, with multiple hypodense nodules throughout the right lobe largest measuring 1.4 cm. No prevertebral soft tissue swelling. No evidence of canal hematoma. Disc levels: Moderate spondylosis at C4-5 and C5-6. Left predominant neural foraminal narrowing at C4-5. Upper chest: Airway is patent. Lung apices demonstrate bullous emphysematous changes. Other: Reconstructed images demonstrate no additional findings. IMPRESSION: 1. Mild cervical spondylosis.  No acute fracture. 2. Enlarged heterogeneous thyroid with multiple hypodense nodules. Recommend thyroid ultrasound (ref: J Am Coll Radiol. 2015 Feb;12(2): 143-50). Electronically Signed   By: Sharlet Salina M.D.   On: 06/15/2020 18:11   MR BRAIN WO CONTRAST  Result Date: 06/15/2020 CLINICAL DATA:  Recurrent syncope.  Multiple falls. EXAM: MRI HEAD WITHOUT CONTRAST TECHNIQUE: Multiplanar, multiecho pulse sequences of the brain and surrounding structures were obtained without intravenous contrast. COMPARISON:  Head CT 06/15/2020 and MRI 08/27/2016 FINDINGS: Brain: There is no evidence of an acute infarct, intracranial hemorrhage, mass, midline shift, or extra-axial fluid collection. The ventricles and sulci are normal. There is patchy T2 hyperintensity in the deep cerebral white matter bilaterally, basal ganglia, thalami and pons which has greatly progressed from 2018, and there is a new discrete chronic lacunar infarct in the genu of the left  internal capsule. A partially empty sella is unchanged. Vascular: Major intracranial vascular flow voids are preserved. Skull and upper cervical spine: Major intracranial vascular flow voids are preserved. Sinuses/Orbits: Unremarkable orbits. Mild circumferential mucosal thickening in the left maxillary sinus. Clear mastoid air cells. Other: None. IMPRESSION: 1. No acute intracranial abnormality. 2. Progressive T2 signal abnormality in the deep cerebral white matter, deep gray nuclei, and pons since 2018 with an interval chronic lacunar infarct in the left internal capsule. This may reflect progressive chronic small vessel ischemic disease, vasculitis, or hypercoagulable state. Electronically Signed   By: Sebastian Ache M.D.   On: 06/15/2020 21:49   US Carotid Bilateral (at [facility] and [facility] only)  Result Date: 06/16/2020 CLINICAL DATA:  Syncope and hypertension. EXAM: BILATERAL CAROTID DUPLEX ULTRASOUND TECHNIQUE: Wallace Cullens scale imaging, color Doppler and duplex ultrasound were performed of bilateral carotid and vertebral arteries in the neck. COMPARISON:  None. FINDINGS: Criteria: Quantification of carotid stenosis is based on velocity parameters that correlate the residual internal carotid diameter with NASCET-based stenosis levels, using the diameter of the distal internal carotid lumen as the denominator for stenosis measurement. The following velocity measurements were obtained: RIGHT ICA:  70/27 cm/sec CCA:  79/24 cm/sec SYSTOLIC ICA/CCA RATIO:  0.9 ECA:  49 cm/sec LEFT ICA:  94/38 cm/sec CCA:  59/16 cm/sec SYSTOLIC ICA/CCA RATIO:  1.6 ECA:  59 cm/sec RIGHT CAROTID ARTERY: Mild calcified plaque is present  at the level of the distal bulb and proximal right ICA. Estimated right ICA stenosis is less than 50%. RIGHT VERTEBRAL ARTERY: Antegrade flow with normal waveform and velocity. LEFT CAROTID ARTERY: Minimal plaque in the carotid bulb. No evidence of left ICA plaque or stenosis. LEFT VERTEBRAL ARTERY: Antegrade flow  with normal waveform and velocity. IMPRESSION: Mild plaque in both carotid bulbs and the proximal right ICA. Estimated right ICA stenosis is less than 50%. There is no evidence of left ICA stenosis. Electronically Signed   By: Irish Lack M.D.   On: 06/16/2020 11:03   ECHOCARDIOGRAM COMPLETE  Result Date: 06/17/2020    ECHOCARDIOGRAM REPORT   Patient Name:   Christina Avery Date of Exam: 06/17/2020 Medical Rec #:  [id number]        Height:       75.0 in Accession #:    [id number]       Weight:       175.7 lb Date of Birth:  07-24-1966        BSA:          2.077 m Patient Age:    54 years         BP:           138/97 mmHg Patient Gender: F                HR:           50 bpm. Exam Location:  [facility] Procedure: 2D Echo, Cardiac Doppler and Color Doppler Indications:     Syncope 780.2  History:         Patient has no prior history of Echocardiogram examinations.                  Migraines.  Sonographer:     Cristela Blue RDCS (AE) Referring Phys:  [id number] Eliezer Mccoy PATEL Diagnosing Phys: Lorine Bears MD IMPRESSIONS  1. Left ventricular ejection fraction, by estimation, is 40 to 45%. The left ventricle has mildly decreased function. The left ventricle demonstrates global hypokinesis. Left ventricular diastolic parameters are consistent with Grade I diastolic dysfunction (impaired relaxation).  2. Right ventricular systolic function is normal. The right ventricular size is normal. There is mildly elevated pulmonary artery systolic pressure. The estimated right ventricular systolic pressure is 35.9 mmHg.  3. Left atrial size was mildly dilated.  4. The mitral valve is abnormal. Moderate mitral valve regurgitation. No evidence of mitral stenosis.  5. The aortic valve is normal in structure. Aortic valve regurgitation is not visualized. Mild to moderate aortic valve sclerosis/calcification is present, without any evidence of aortic stenosis.  6. The inferior vena cava is normal in size with greater than 50% respiratory  variability, suggesting right atrial pressure of 3 mmHg. FINDINGS  Left Ventricle: Left ventricular ejection fraction, by estimation, is 40 to 45%. The left ventricle has mildly decreased function. The left ventricle demonstrates global hypokinesis. The left ventricular internal cavity size was normal in size. There is  no left ventricular hypertrophy. Left ventricular diastolic parameters are consistent with Grade I diastolic dysfunction (impaired relaxation). Right Ventricle: The right ventricular size is normal. No increase in right ventricular wall thickness. Right ventricular systolic function is normal. There is mildly elevated pulmonary artery systolic pressure. The tricuspid regurgitant velocity is 2.78  m/s, and with an assumed right atrial pressure of 5 mmHg, the estimated right ventricular systolic pressure is 35.9 mmHg. Left Atrium: Left atrial size was mildly dilated. Right Atrium: Right atrial size  was normal in size. Pericardium: There is no evidence of pericardial effusion. Mitral Valve: The mitral valve is abnormal. There is moderate thickening of the mitral valve leaflet(s). There is mild calcification of the mitral valve leaflet(s). Moderate mitral valve regurgitation. No evidence of mitral valve stenosis. Tricuspid Valve: The tricuspid valve is normal in structure. Tricuspid valve regurgitation is mild . No evidence of tricuspid stenosis. Aortic Valve: The aortic valve is normal in structure. Aortic valve regurgitation is not visualized. Mild to moderate aortic valve sclerosis/calcification is present, without any evidence of aortic stenosis. Aortic valve mean gradient measures 4.0 mmHg. Aortic valve peak gradient measures 6.5 mmHg. Aortic valve area, by VTI measures 2.47 cm. Pulmonic Valve: The pulmonic valve was normal in structure. Pulmonic valve regurgitation is not visualized. No evidence of pulmonic stenosis. Aorta: The aortic root is normal in size and structure. Venous: The inferior vena  cava is normal in size with greater than 50% respiratory variability, suggesting right atrial pressure of 3 mmHg. IAS/Shunts: No atrial level shunt detected by color flow Doppler.  LEFT VENTRICLE PLAX 2D LVIDd:         4.51 cm      Diastology LVIDs:         3.78 cm      LV e' medial:    5.00 cm/s LV PW:         1.16 cm      LV E/e' medial:  13.5 LV IVS:        0.88 cm      LV e' lateral:   5.77 cm/s LVOT diam:     2.30 cm      LV E/e' lateral: 11.7 LV SV:         52 LV SV Index:   25 LVOT Area:     4.15 cm  LV Volumes (MOD) LV vol d, MOD A2C: 143.0 ml LV vol d, MOD A4C: 128.0 ml LV vol s, MOD A2C: 80.6 ml LV vol s, MOD A4C: 87.5 ml LV SV MOD A2C:     62.4 ml LV SV MOD A4C:     128.0 ml LV SV MOD BP:      57.9 ml RIGHT VENTRICLE RV Basal diam:  3.66 cm RV S prime:     12.00 cm/s TAPSE (M-mode): 3.9 cm LEFT ATRIUM           Index       RIGHT ATRIUM           Index LA diam:      3.20 cm 1.54 cm/m  RA Area:     14.80 cm LA Vol (A2C): 66.4 ml 31.97 ml/m RA Volume:   44.40 ml  21.38 ml/m LA Vol (A4C): 39.4 ml 18.97 ml/m  AORTIC VALVE                   PULMONIC VALVE AV Area (Vmax):    1.86 cm    PV Vmax:       0.58 m/s AV Area (Vmean):   1.93 cm    PV Peak grad:  1.4 mmHg AV Area (VTI):     2.47 cm AV Vmax:           127.00 cm/s AV Vmean:          89.300 cm/s AV VTI:            0.209 m AV Peak Grad:      6.5 mmHg AV Mean Grad:  4.0 mmHg LVOT Vmax:         56.80 cm/s LVOT Vmean:        41.500 cm/s LVOT VTI:          0.124 m LVOT/AV VTI ratio: 0.59  AORTA Ao Root diam: 3.40 cm MITRAL VALVE               TRICUSPID VALVE MV Area (PHT): 2.20 cm    TR Peak grad:   30.9 mmHg MV Decel Time: 345 msec    TR Vmax:        278.00 cm/s MV E velocity: 67.70 cm/s MV A velocity: 99.40 cm/s  SHUNTS MV E/A ratio:  0.68        Systemic VTI:  0.12 m                            Systemic Diam: 2.30 cm Lorine Bears MD Electronically signed by Lorine Bears MD Signature Date/Time: 06/17/2020/10:47:10 AM    Final         Subjective: No chest pain or issues today  Discharge Exam: Vitals:   06/18/20 0426 06/18/20 0730  BP: 114/88 107/88  Pulse: 72 96  Resp: 16 17  Temp: 97.8 F (36.6 C) 98.1 F (36.7 C)  SpO2: 98% 98%   Vitals:   06/17/20 1608 06/17/20 2024 06/18/20 0426 06/18/20 0730  BP: (!) 139/91 (!) 149/96 114/88 107/88  Pulse: 67 (!) 46 72 96  Resp: Temp: 98.3 F (36.8 C) 98.5 F (36.9 C) 97.8 F (36.6 C) 98.1 F (36.7 C)  TempSrc: Oral Oral Oral Oral  SpO2: 100% 99% 98% 98%  Weight:      Height:        General: Pt is alert, awake, not in acute distress Cardiovascular: RRR, S1/S2 +, no rubs, no gallops Respiratory: CTA bilaterally, no wheezing, no rhonchi Abdominal: Soft, NT, ND, bowel sounds + Extremities: no edema, no cyanosis    The results of significant diagnostics from this hospitalization (including imaging, microbiology, ancillary and laboratory) are listed below for reference.     Microbiology: Recent Results (from the past 240 hour(s))  Respiratory Panel by RT PCR (Flu A&B, Covid) - Nasopharyngeal Swab     Status: None   Collection Time: 06/15/20  7:22 PM   Specimen: Nasopharyngeal Swab  Result Value Ref Range Status   SARS Coronavirus 2 by RT PCR NEGATIVE NEGATIVE Final    Comment: (NOTE) SARS-CoV-2 target nucleic acids are NOT DETECTED.  The SARS-CoV-2 RNA is generally detectable in upper respiratoy specimens during the acute phase of infection. The lowest concentration of SARS-CoV-2 viral copies this assay can detect is 131 copies/mL. A negative result does not preclude SARS-Cov-2 infection and should not be used as the sole basis for treatment or other patient management decisions. A negative result may occur with  improper specimen collection/handling, submission of specimen other than nasopharyngeal swab, presence of viral mutation(s) within the areas targeted by this assay, and inadequate number of viral copies (<131 copies/mL).  A negative result must be combined with clinical observations, patient history, and epidemiological information. The expected result is Negative.  Fact Sheet for Patients:  https://www.moore.com/  Fact Sheet for Healthcare Providers:  https://www.young.biz/  This test is no t yet approved or cleared by the [place] FDA and  has been authorized for detection and/or diagnosis of SARS-CoV-2 by FDA under an Emergency Use Authorization (EUA). This EUA  will remain  in effect (meaning this test can be used) for the duration of the COVID-19 declaration under Section 564(b)(1) of the Act, 21 U.S.C. section 360bbb-3(b)(1), unless the authorization is terminated or revoked sooner.     Influenza A by PCR NEGATIVE NEGATIVE Final   Influenza B by PCR NEGATIVE NEGATIVE Final    Comment: (NOTE) The Xpert Xpress SARS-CoV-2/FLU/RSV assay is intended as an aid in  the diagnosis of influenza from Nasopharyngeal swab specimens and  should not be used as a sole basis for treatment. Nasal washings and  aspirates are unacceptable for Xpert Xpress SARS-CoV-2/FLU/RSV  testing.  Fact Sheet for Patients: https://www.moore.com/  Fact Sheet for Healthcare Providers: https://www.young.biz/  This test is not yet approved or cleared by the [place] FDA and  has been authorized for detection and/or diagnosis of SARS-CoV-2 by  FDA under an Emergency Use Authorization (EUA). This EUA will remain  in effect (meaning this test can be used) for the duration of the  Covid-19 declaration under Section 564(b)(1) of the Act, 21  U.S.C. section 360bbb-3(b)(1), unless the authorization is  terminated or revoked. Performed at [facility], [address] [place], [place] [address]      Labs: BNP (last 3 results) No results for input(s): BNP in the last 8760 hours. Basic Metabolic Panel: Recent Labs  Lab  06/15/20 1718 06/16/20 0215 06/17/20 1343  NA 136  --  135  K 3.5  --  3.7  CL 105  --  104  CO2 19*  --  20*  GLUCOSE 105*  --  139*  BUN 14  --  9  CREATININE 0.91  --  0.82  CALCIUM 9.1  --  9.4  MG 1.9  --   --   PHOS  --  3.7  --    Liver Function Tests: Recent Labs  Lab 06/15/20 1916  AST 29  ALT 19  ALKPHOS 81  BILITOT 1.5*  PROT 7.5  ALBUMIN 3.8   No results for input(s): LIPASE, AMYLASE in the last 168 hours. No results for input(s): AMMONIA in the last 168 hours. CBC: Recent Labs  Lab 06/15/20 1718 06/17/20 1343  WBC 5.3 4.7  HGB 14.0 13.8  HCT 41.7 40.4  MCV 98.6 96.2  PLT 219 226   Cardiac Enzymes: Recent Labs  Lab 06/16/20 0215  CKTOTAL 116   BNP: Invalid input(s): POCBNP CBG: No results for input(s): GLUCAP in the last 168 hours. D-Dimer No results for input(s): DDIMER in the last 72 hours. Hgb A1c Recent Labs    06/15/20 2317  HGBA1C 4.7*   Lipid Profile Recent Labs    06/16/20 0215  CHOL 156  HDL 59  LDLCALC 86  TRIG 53  CHOLHDL 2.6   Thyroid function studies Recent Labs    06/15/20 2317  TSH 0.371   Anemia work up Recent Labs    06/15/20 2317  VITAMINB12 102*  FOLATE 14.6  FERRITIN 18  TIBC 396  IRON 81  RETICCTPCT 2.0   Urinalysis No results found for: COLORURINE, APPEARANCEUR, LABSPEC, PHURINE, GLUCOSEU, HGBUR, BILIRUBINUR, KETONESUR, PROTEINUR, UROBILINOGEN, NITRITE, LEUKOCYTESUR Sepsis Labs Invalid input(s): PROCALCITONIN,  WBC,  LACTICIDVEN Microbiology Recent Results (from the past 240 hour(s))  Respiratory Panel by RT PCR (Flu A&B, Covid) - Nasopharyngeal Swab     Status: None   Collection Time: 06/15/20  7:22 PM   Specimen: Nasopharyngeal Swab  Result Value Ref Range Status   SARS Coronavirus 2 by RT PCR NEGATIVE  NEGATIVE Final    Comment: (NOTE) SARS-CoV-2 target nucleic acids are NOT DETECTED.  The SARS-CoV-2 RNA is generally detectable in upper respiratoy specimens during the acute phase of  infection. The lowest concentration of SARS-CoV-2 viral copies this assay can detect is 131 copies/mL. A negative result does not preclude SARS-Cov-2 infection and should not be used as the sole basis for treatment or other patient management decisions. A negative result may occur with  improper specimen collection/handling, submission of specimen other than nasopharyngeal swab, presence of viral mutation(s) within the areas targeted by this assay, and inadequate number of viral copies (<131 copies/mL). A negative result must be combined with clinical observations, patient history, and epidemiological information. The expected result is Negative.  Fact Sheet for Patients:  https://www.moore.com/  Fact Sheet for Healthcare Providers:  https://www.young.biz/  This test is no t yet approved or cleared by the [place] FDA and  has been authorized for detection and/or diagnosis of SARS-CoV-2 by FDA under an Emergency Use Authorization (EUA). This EUA will remain  in effect (meaning this test can be used) for the duration of the COVID-19 declaration under Section 564(b)(1) of the Act, 21 U.S.C. section 360bbb-3(b)(1), unless the authorization is terminated or revoked sooner.     Influenza A by PCR NEGATIVE NEGATIVE Final   Influenza B by PCR NEGATIVE NEGATIVE Final    Comment: (NOTE) The Xpert Xpress SARS-CoV-2/FLU/RSV assay is intended as an aid in  the diagnosis of influenza from Nasopharyngeal swab specimens and  should not be used as a sole basis for treatment. Nasal washings and  aspirates are unacceptable for Xpert Xpress SARS-CoV-2/FLU/RSV  testing.  Fact Sheet for Patients: https://www.moore.com/  Fact Sheet for Healthcare Providers: https://www.young.biz/  This test is not yet approved or cleared by the [place] FDA and  has been authorized for detection and/or diagnosis of SARS-CoV-2  by  FDA under an Emergency Use Authorization (EUA). This EUA will remain  in effect (meaning this test can be used) for the duration of the  Covid-19 declaration under Section 564(b)(1) of the Act, 21  U.S.C. section 360bbb-3(b)(1), unless the authorization is  terminated or revoked. Performed at [facility], [address] [place], [place] [address]      Time coordinating discharge: Over 30 minutes  SIGNED:   Lynn Ito, MD  [facility] 06/18/2020, 11:06 AM Pager   If 7PM-7AM, please contact night-coverage [email or web address] Password [email or web address]

## 2020-06-18 NOTE — TOC Transition Note (Signed)
Transition of Care [facility]) - CM/SW Discharge Note   Patient Details  Name: Christina Avery MRN: [id number] Date of Birth: [birth year]  [facility] of [facility]) CM/SW Contact:  Magnus Ivan, LCSW Phone Number: 06/18/2020, 11:16 AM   Clinical Narrative:   Patient has orders to discharge home today. CSW met with patient. Provided another [facility]ication Management application which patient plans to fill out. Medications are being prescribed to Medication Management. Patient reported she will have her sister pick her up and understands to go pick up her medications from Medication Management at discharge. Patient denied additional needs.    Final next level of care: Home/Self Care     Patient Goals and CMS Choice Patient states their goals for this hospitalization and ongoing recovery are:: to return home CMS Medicare.gov Compare Post Acute Care list provided to:: Patient Choice offered to / list presented to : Patient  Discharge Placement                Patient to be transferred to facility by: transport home by sister Name of family member notified: patient notified Patient and family notified of of transfer: 06/18/20  Discharge Plan and Services                                     Social Determinants of Health (SDOH) Interventions     Readmission Risk Interventions No flowsheet data found.

## 2020-06-18 NOTE — Progress Notes (Signed)
IV and tele removed from patient. Discharge instructions given to patient. Verbalized understanding. No acute distress at this time. Patient to call for wheelchair when her ride arrives at the medical mall entrance.

## 2020-06-25 ENCOUNTER — Ambulatory Visit: Payer: Self-pay | Admitting: Physician Assistant

## 2020-06-25 NOTE — Progress Notes (Deleted)
Office Visit    Patient Name: Christina Avery Date of Encounter: 06/25/2020  Primary Care Provider:  Patient, No Pcp Per Primary Cardiologist:  No primary care provider on file.  Chief Complaint    No chief complaint on file.   54 y.o. female with no previously known history of heart disease and family history of heart disease in her mother with details vague, polysubstance use with recent cocaine / tobacco use/ marijuana /alcohol use, combined systolic and diastolic with EF 10-25% by recent 06/17/2020 echo, moderate MR, moderate aortic sclerosis without stenosis, pulmonary hypertension, CAC by CT, aortic atherosclerosis by CT, RICA carotid dz, and seen today for hospital follow-up.  Past Medical History    Past Medical History:  Diagnosis Date  . Migraines    Past Surgical History:  Procedure Laterality Date  . CESAREAN SECTION    . CHOLECYSTECTOMY      Allergies  No Known Allergies  History of Present Illness    Christina Avery is a 54 y.o. female with PMH as above.   She reports that she does not have insurance, which will influence her which will influence her ability to take medications and follow-up.    She has family history of heart disease but is unable to provide further details.  She reports heart disease in her mother.  She reports current polysubstance use with last polysubstance use on Thursday, 06/13/2020.  At that time, she reports alcohol use/drinking 2 brandy's, cocaine use, and tobacco use.  She denies using since that time.  She does continue to smoke tobacco, reporting 1/4 pack of tobacco/cigarettes per day.  On 06/15/2020, she presented to [facility] emergency department after syncope and collapse thought 2/2 vasovagal etiology.  She was at her friend's and felt warm and dizzy while on the couch with the heat up high. She reported diaphoresis.On her way out and as approaching the door, she fell back and hit her head.  She fell again in the house and  again in the yard. She rested on the porch. She fell twice by the car and a neighbor drove her home, at which time she had another fall.  She reports 5 episodes of syncope and collapse in total.    In the ED, labs showed hypokalemia and tox screen positive for cocaine and cannabinoids.  TSH borderline low. CT head with chronic left basal ganglia lacunar infarct.  CT spine with spondylosis and enlarged thyroid with multiple nodules & recommendation for thyroid US. MR thought to reflect progressive chronic small vessel ischemic disease .  CTA chest  with moderate coronary artery calcification, aortic atherosclerosis, and emphysema.  Carotid ultrasound with mild plaque in both carotid bulbs and proximal right ICA.  Estimated right ICA stenosis less than 50%.  No evidence of left ICA stenosis. Once admitted, echo with EF 40-45%, attributed to EtOH or cocaine use. Losartan was started and to be uptitrated. She was started on Coreg. Ischemic workup deferred.   Home Medications    Current Outpatient Medications on File Prior to Visit  Medication Sig Dispense Refill  . aspirin EC 81 MG EC tablet Take 1 tablet (81 mg total) by mouth daily. Swallow whole. 30 tablet 0  . atorvastatin (LIPITOR) 10 MG tablet Take 1 tablet (10 mg total) by mouth daily. 30 tablet 0  . CALCIUM PO Take 1 tablet by mouth daily.    . carvedilol (COREG) 3.125 MG tablet Take 1 tablet (3.125 mg total) by mouth 2 (two) times  daily with a meal. 60 tablet 0  . ferrous sulfate 325 (65 FE) MG tablet Take 325 mg by mouth daily with breakfast.    . losartan (COZAAR) 25 MG tablet Take 1 tablet (25 mg total) by mouth daily. 30 tablet 0  . Multiple Vitamins-Minerals (MULTIVITAMIN WITH MINERALS) tablet Take 1 tablet by mouth daily.    Marland Kitchen omeprazole (PRILOSEC) 40 MG capsule Take 40 mg by mouth daily.    Marland Kitchen terbinafine (LAMISIL) 250 MG tablet Take 250 mg by mouth daily.    Marland Kitchen thiamine 100 MG tablet Take 1 tablet (100 mg total) by mouth daily. 30  tablet 1  . vitamin B-12 1000 MCG tablet Take 1 tablet (1,000 mcg total) by mouth daily. 30 tablet 0   No current facility-administered medications on file prior to visit.    Review of Systems    ***.   All other systems reviewed and are otherwise negative except as noted above.  Physical Exam    VS:  There were no vitals taken for this visit. , BMI There is no height or weight on file to calculate BMI. GEN: Well nourished, well developed, in no acute distress. HEENT: normal. Neck: Supple, no JVD, carotid bruits, or masses. Cardiac: RRR, no murmurs, rubs, or gallops. No clubbing, cyanosis, edema.  Radials/DP/PT 2+ and equal bilaterally.  Respiratory:  Respirations regular and unlabored, clear to auscultation bilaterally. GI: Soft, nontender, nondistended, BS + x 4. MS: no deformity or atrophy. Skin: warm and dry, no rash. Neuro:  Strength and sensation are intact. Psych: Normal affect.  Accessory Clinical Findings    ECG personally reviewed by me today - *** - no acute changes.  VITALS Reviewed today   Temp Readings from Last 3 Encounters:  06/18/20 98.1 F (36.7 C) (Oral)  09/05/19 98.5 F (36.9 C) (Oral)  03/15/19 98.2 F (36.8 C) (Oral)   BP Readings from Last 3 Encounters:  06/18/20 107/88  09/05/19 (!) 128/98  03/16/19 (!) 154/102   Pulse Readings from Last 3 Encounters:  06/18/20 96  09/05/19 86  03/16/19 61    Wt Readings from Last 3 Encounters:  06/16/20 175 lb 11.3 oz (79.7 kg)  09/05/19 200 lb (90.7 kg)  03/15/19 212 lb (96.2 kg)     LABS  reviewed today   Lab Results  Component Value Date   WBC 4.7 06/17/2020   HGB 13.8 06/17/2020   HCT 40.4 06/17/2020   MCV 96.2 06/17/2020   PLT 226 06/17/2020   Lab Results  Component Value Date   CREATININE 0.82 06/17/2020   BUN 9 06/17/2020   NA 135 06/17/2020   K 3.7 06/17/2020   CL 104 06/17/2020   CO2 20 (L) 06/17/2020   Lab Results  Component Value Date   ALT 19 06/15/2020   AST 29  06/15/2020   GGT 80 (H) 06/15/2020   ALKPHOS 81 06/15/2020   BILITOT 1.5 (H) 06/15/2020   Lab Results  Component Value Date   CHOL 156 06/16/2020   HDL 59 06/16/2020   LDLCALC 86 06/16/2020   TRIG 53 06/16/2020   CHOLHDL 2.6 06/16/2020    Lab Results  Component Value Date   HGBA1C 4.7 (L) 06/15/2020   Lab Results  Component Value Date   TSH 0.371 06/15/2020     STUDIES/PROCEDURES reviewed today   *** Echo 06/17/20  1. Left ventricular ejection fraction, by estimation, is 40 to 45%. The  left ventricle has mildly decreased function. The left ventricle  demonstrates  global hypokinesis. Left ventricular diastolic parameters are  consistent with Grade I diastolic  dysfunction (impaired relaxation).  2. Right ventricular systolic function is normal. The right ventricular  size is normal. There is mildly elevated pulmonary artery systolic  pressure. The estimated right ventricular systolic pressure is 35.9 mmHg.  3. Left atrial size was mildly dilated.  4. The mitral valve is abnormal. Moderate mitral valve regurgitation. No  evidence of mitral stenosis.  5. The aortic valve is normal in structure. Aortic valve regurgitation is  not visualized. Mild to moderate aortic valve sclerosis/calcification is  present, without any evidence of aortic stenosis.  6. The inferior vena cava is normal in size with greater than 50%  respiratory variability, suggesting right atrial pressure of 3 mmHg.   Assessment & Plan    ***  Medication changes: *** Labs ordered: *** Studies / Imaging ordered: *** Future considerations: *** Disposition: ***  Total time spent with patient today *** minutes. This includes reviewing records, evaluating the patient, and coordinating care. Face-to-face time >50%.    Lennon Alstrom, PA-C 06/25/2020

## 2020-06-26 ENCOUNTER — Encounter: Payer: Self-pay | Admitting: Physician Assistant

## 2020-07-22 ENCOUNTER — Ambulatory Visit: Payer: Self-pay

## 2020-08-30 ENCOUNTER — Telehealth: Payer: Self-pay | Admitting: Pharmacist

## 2020-08-30 NOTE — Telephone Encounter (Signed)
Patient failed to provide requested 2021 and 2022 financial documentation. No additional medication assistance will be provided by [facility] without the required proof of income documentation. Patient notified by letter. Debra Cheek Administrative Assistant [facility] 

## 2020-09-02 ENCOUNTER — Ambulatory Visit: Payer: Self-pay | Admitting: Family

## 2020-09-02 NOTE — Progress Notes (Deleted)
   Office Visit    Patient Name: Christina Avery Date of Encounter: 09/02/2020  Primary Care Provider:  Patient, No Pcp Per Primary Cardiologist:  No primary care provider on file. Electrophysiologist:  None   Chief Complaint    Christina Avery is a 55 y.o. female with a hx of ***hypertension, syncope, alcohol use, cocaine use presents today for ***   Past Medical History    Past Medical History:  Diagnosis Date  . Migraines    Past Surgical History:  Procedure Laterality Date  . CESAREAN SECTION    . CHOLECYSTECTOMY      Allergies  No Known Allergies  History of Present Illness    Christina Avery is a 55 y.o. female with a hx of *** last seen while hospitalized.  Admitted 06/15/2020 after recurrent episodes of syncope.  Possibly vasovagal in the setting of polysubstance abuse.  Orthostatic vital signs while admitted were negative.  CT head with chronic appearing left basal ganglia lacunar infarct.  Carotid duplex without hemodynamically significant stenosis.  Echocardiogram showed LVEF 40 to 45%.  Telemetry monitoring with occasional PAC and PVC.  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  Ischemic evaluation was not recommended.  Of note, she had multiple thyroid nodules noted on CT scan recommended for follow-up outpatient ultrasound.  ***   EKGs/Labs/Other Studies Reviewed:   The following studies were reviewed today: ***  EKG:  EKG is *** ordered today.  The ekg ordered today demonstrates ***  Recent Labs: 06/15/2020: ALT 19; Magnesium 1.9; TSH 0.371 06/17/2020: BUN 9; Creatinine, Ser 0.82; Hemoglobin 13.8; Platelets 226; Potassium 3.7; Sodium 135  Recent Lipid Panel    Component Value Date/Time   CHOL 156 06/16/2020 0215   TRIG 53 06/16/2020 0215   HDL 59 06/16/2020 0215   CHOLHDL 2.6 06/16/2020 0215   VLDL 11 06/16/2020 0215   LDLCALC 86 06/16/2020 0215    Risk Assessment/Calculations:  {Does this patient have ATRIAL  FIBRILLATION?:[phone]}  Home Medications   No outpatient medications have been marked as taking for the 09/02/20 encounter (Appointment) with Alver Sorrow, NP.     Review of Systems   ***   ROS All other systems reviewed and are otherwise negative except as noted above.  Physical Exam    VS:  There were no vitals taken for this visit. , BMI There is no height or weight on file to calculate BMI.  Wt Readings from Last 3 Encounters:  06/16/20 175 lb 11.3 oz (79.7 kg)  09/05/19 200 lb (90.7 kg)  03/15/19 212 lb (96.2 kg)     GEN: Well nourished, well developed, in no acute distress. HEENT: normal. Neck: Supple, no JVD, carotid bruits, or masses. Cardiac: ***RRR, no murmurs, rubs, or gallops. No clubbing, cyanosis, edema.  ***Radials/DP/PT 2+ and equal bilaterally.  Respiratory:  ***Respirations regular and unlabored, clear to auscultation bilaterally. GI: Soft, nontender, nondistended. MS: No deformity or atrophy. Skin: Warm and dry, no rash. Neuro:  Strength and sensation are intact. Psych: Normal affect.  Assessment & Plan    1. Syncope- 2. Cardiomyopathy- 3. Polysubstance use- 4. PAC/PVC- 5. Moderate MR- 6. Coronary artery calcification on CT- 7. Hypertension- 8. Hyperlipidemia- 9. Thyroid nodule-  Disposition: Follow up {follow up:15908} with ***   Signed, Alver Sorrow, NP 09/02/2020, 1:29 PM [facility]

## 2020-09-02 NOTE — Progress Notes (Deleted)
Cardiology Office Note    Date:  09/02/2020   ID:  Christina Avery, DOB 1966/02/29, MRN [id number]  PCP:  Patient, No Pcp Per  Cardiologist:  Lorine Bears, MD  Electrophysiologist:  None   Chief Complaint: Follow-up  History of Present Illness:   Christina Avery is a 55 y.o. female with history of coronary artery calcium and aortic atherosclerosis by noninvasive imaging, HFrEF, prolonged polysubstance abuse including cocaine, marijuana, alcohol, and tobacco use, CVA, moderate mitral regurgitation, pulmonary hypertension, mild right-sided carotid artery disease, and migraine disorder who presents for follow-up of her cardiomyopathy.  She indicated a prolonged history of daily polysubstance abuse including cocaine, marijuana, alcohol, and tobacco.  Prior to her admission in 06/2020 she indicated using the substances in excess.  She was admitted to the hospital in 06/2020 with recurrent episodes of syncope preceded by a warm and dizzy sensation followed by LOC.  She denied any chest pain or dyspnea.  No palpitations.  EKG during admission showed sinus rhythm with possible left atrial enlargement and no significant ST-T changes.  Telemetry showed sinus rhythm with PACs and PVCs with no significant arrhythmia.  High-sensitivity troponin was negative x2.  Urine drug screen positive for cocaine and cannabinoid.  GGT elevated at 80.  Echo showed an EF of 40 to 45%, global hypokinesis, grade 1 diastolic dysfunction, normal RV systolic function and ventricular cavity size, moderate mitral regurgitation, mild to moderate aortic valve sclerosis without evidence of stenosis, PASP 35.9 mmHg, and an estimated right atrial pressure of 3 mmHg.  Carotid artery ultrasound showed no evidence of left ICA stenosis with less than 50% of right ICA stenosis.  CT head/cervical spine showed no acute intracranial process with a chronic appearing left basal ganglia lacunar infarct as well as incidentally noted multiple  hypodense thyroid nodules.  CTA chest showed no evidence of PE with coronary artery calcification and aortic atherosclerosis and emphysema noted.  MRI of the brain showed no acute intracranial abnormality.  Along with chronic lacunar infarct and incidental changes concerning for possible chronic small vessel ischemic disease, vasculitis, or hypercoagulable state.  Her syncope was suspected to be vasovagal in etiology in the setting of polysubstance abuse.  With regards to her cardiomyopathy, this was suspected to be likely substance use in etiology likely alcohol and possibly related to cocaine.  She was initiated on losartan and carvedilol with recommendation to follow-up as an outpatient for further evaluation of her cardiomyopathy.  *** Needs thyroid ultrasound follow-up Needs PCP for follow-up of MRI   Labs independently reviewed: 06/2020 - Hgb 13.8, PLT 226, potassium 3.7, BUN 9, serum creatinine 0.82, TC 156, TG 53, HDL 59, LDL 86, A1c 4.7, TSH normal, free T4 normal, magnesium 1.9  Past Medical History:  Diagnosis Date  . Migraines     Past Surgical History:  Procedure Laterality Date  . CESAREAN SECTION    . CHOLECYSTECTOMY      Current Medications: No outpatient medications have been marked as taking for the 09/03/20 encounter (Appointment) with Sondra Barges, PA-C.    Allergies:   Patient has no known allergies.   Social History   Socioeconomic History  . Marital status: Single    Spouse name: Not on file  . Number of children: Not on file  . Years of education: Not on file  . Highest education level: Not on file  Occupational History  . Not on file  Tobacco Use  . Smoking status: Current Every Day Smoker  Packs/day: 0.50    Types: Cigarettes  . Smokeless tobacco: Never Used  Substance and Sexual Activity  . Alcohol use: Yes    Comment: occ  . Drug use: Yes    Types: Marijuana  . Sexual activity: Yes    Birth control/protection: None  Other Topics Concern  .  Not on file  Social History Narrative  . Not on file   Social Determinants of Health   Financial Resource Strain: Not on file  Food Insecurity: Not on file  Transportation Needs: Not on file  Physical Activity: Not on file  Stress: Not on file  Social Connections: Not on file     Family History:  The patient's family history is not on file.  ROS:   ROS   EKGs/Labs/Other Studies Reviewed:    Studies reviewed were summarized above. The additional studies were reviewed today:  2D echo 06/2020: 1. Left ventricular ejection fraction, by estimation, is 40 to 45%. The  left ventricle has mildly decreased function. The left ventricle  demonstrates global hypokinesis. Left ventricular diastolic parameters are  consistent with Grade I diastolic  dysfunction (impaired relaxation).  2. Right ventricular systolic function is normal. The right ventricular  size is normal. There is mildly elevated pulmonary artery systolic  pressure. The estimated right ventricular systolic pressure is 35.9 mmHg.  3. Left atrial size was mildly dilated.  4. The mitral valve is abnormal. Moderate mitral valve regurgitation. No  evidence of mitral stenosis.  5. The aortic valve is normal in structure. Aortic valve regurgitation is  not visualized. Mild to moderate aortic valve sclerosis/calcification is  present, without any evidence of aortic stenosis.  6. The inferior vena cava is normal in size with greater than 50%  respiratory variability, suggesting right atrial pressure of 3 mmHg. __________  Carotid artery ultrasound 06/2020: IMPRESSION: Mild plaque in both carotid bulbs and the proximal right ICA. Estimated right ICA stenosis is less than 50%. There is no evidence of left ICA stenosis.  EKG:  EKG is ordered today.  The EKG ordered today demonstrates ***  Recent Labs: 06/15/2020: ALT 19; Magnesium 1.9; TSH 0.371 06/17/2020: BUN 9; Creatinine, Ser 0.82; Hemoglobin 13.8; Platelets 226;  Potassium 3.7; Sodium 135  Recent Lipid Panel    Component Value Date/Time   CHOL 156 06/16/2020 0215   TRIG 53 06/16/2020 0215   HDL 59 06/16/2020 0215   CHOLHDL 2.6 06/16/2020 0215   VLDL 11 06/16/2020 0215   LDLCALC 86 06/16/2020 0215    PHYSICAL EXAM:    VS:  There were no vitals taken for this visit.  BMI: There is no height or weight on file to calculate BMI.  Physical Exam  Wt Readings from Last 3 Encounters:  06/16/20 175 lb 11.3 oz (79.7 kg)  09/05/19 200 lb (90.7 kg)  03/15/19 212 lb (96.2 kg)     ASSESSMENT & PLAN:   1. HFrEF: ***  2. Mitral regurgitation:  3. Polysubstance abuse:  4. Thyroid nodules:  5. History of CVA/abnormal MRI brain:  6. Pulmonary hypertension:  7. Carotid artery disease:  Disposition: F/u with Dr. Kirke Corin or an APP in ***.   Medication Adjustments/Labs and Tests Ordered: Current medicines are reviewed at length with the patient today.  Concerns regarding medicines are outlined above. Medication changes, Labs and Tests ordered today are summarized above and listed in the Patient Instructions accessible in Encounters.   Signed, Eula Listen, PA-C 09/02/2020 3:24 PM     [facility] [address] [place], [place] [address] [phone]

## 2020-09-03 ENCOUNTER — Ambulatory Visit: Payer: Self-pay | Admitting: Physician Assistant

## 2020-09-04 ENCOUNTER — Encounter: Payer: Self-pay | Admitting: Physician Assistant

## 2020-09-12 ENCOUNTER — Ambulatory Visit: Payer: Self-pay | Admitting: Family

## 2020-09-12 NOTE — Progress Notes (Deleted)
Office Visit    Patient Name: Christina Avery Date of Encounter: 09/12/2020  Primary Care Provider:  Patient, No Pcp Per Primary Cardiologist:  Lorine Bears, MD Electrophysiologist:  None   Chief Complaint    Christina Avery is a 55 y.o. female with a hx of ***hypertension, syncope, alcohol use, cocaine use presents today for ***   Past Medical History    Past Medical History:  Diagnosis Date  . Migraines    Past Surgical History:  Procedure Laterality Date  . CESAREAN SECTION    . CHOLECYSTECTOMY      Allergies  No Known Allergies  History of Present Illness    Christina Avery is a 55 y.o. female with a hx of *** last seen while hospitalized.  Admitted 06/15/2020 after recurrent episodes of syncope.  Possibly vasovagal in the setting of polysubstance abuse.  Orthostatic vital signs while admitted were negative.  CT head with chronic appearing left basal ganglia lacunar infarct.  Carotid duplex without hemodynamically significant stenosis.  Echocardiogram showed LVEF 40 to 45%.  Telemetry monitoring with occasional PAC and PVC.  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  Ischemic evaluation was not recommended.  Of note, she had multiple thyroid nodules noted on CT scan recommended for follow-up outpatient ultrasound.  ***   EKGs/Labs/Other Studies Reviewed:   The following studies were reviewed today:  Echo 06/17/20 1. Left ventricular ejection fraction, by estimation, is 40 to 45%. The  left ventricle has mildly decreased function. The left ventricle  demonstrates global hypokinesis. Left ventricular diastolic parameters are  consistent with Grade I diastolic  dysfunction (impaired relaxation).   2. Right ventricular systolic function is normal. The right ventricular  size is normal. There is mildly elevated pulmonary artery systolic  pressure. The estimated right ventricular systolic pressure is 35.9 mmHg.   3. Left atrial  size was mildly dilated.   4. The mitral valve is abnormal. Moderate mitral valve regurgitation. No  evidence of mitral stenosis.   5. The aortic valve is normal in structure. Aortic valve regurgitation is  not visualized. Mild to moderate aortic valve sclerosis/calcification is  present, without any evidence of aortic stenosis.   6. The inferior vena cava is normal in size with greater than 50%  respiratory variability, suggesting right atrial pressure of 3 mmHg.    EKG:  EKG is ordered today.  The ekg ordered today demonstrates ***  Recent Labs: 06/15/2020: ALT 19; Magnesium 1.9; TSH 0.371 06/17/2020: BUN 9; Creatinine, Ser 0.82; Hemoglobin 13.8; Platelets 226; Potassium 3.7; Sodium 135  Recent Lipid Panel    Component Value Date/Time   CHOL 156 06/16/2020 0215   TRIG 53 06/16/2020 0215   HDL 59 06/16/2020 0215   CHOLHDL 2.6 06/16/2020 0215   VLDL 11 06/16/2020 0215   LDLCALC 86 06/16/2020 0215    Home Medications   No outpatient medications have been marked as taking for the 09/12/20 encounter (Appointment) with Alver Sorrow, NP.     Review of Systems   ***   ROS All other systems reviewed and are otherwise negative except as noted above.  Physical Exam    VS:  There were no vitals taken for this visit. , BMI There is no height or weight on file to calculate BMI.  Wt Readings from Last 3 Encounters:  06/16/20 175 lb 11.3 oz (79.7 kg)  09/05/19 200 lb (90.7 kg)  03/15/19 212 lb (96.2 kg)     GEN:  Well nourished, well developed, in no acute distress. HEENT: normal. Neck: Supple, no JVD, carotid bruits, or masses. Cardiac: ***RRR, no murmurs, rubs, or gallops. No clubbing, cyanosis, edema.  ***Radials/DP/PT 2+ and equal bilaterally.  Respiratory:  ***Respirations regular and unlabored, clear to auscultation bilaterally. GI: Soft, nontender, nondistended. MS: No deformity or atrophy. Skin: Warm and dry, no rash. Neuro:  Strength and sensation are  intact. Psych: Normal affect.  Assessment & Plan    1. Syncope-  2. Cardiomyopathy-  3. Polysubstance use-  4. PAC/PVC-  5. Moderate MR-  6. Coronary artery calcification on CT-  7. Hypertension-  8. Hyperlipidemia-  9. Thyroid nodule-  Disposition: Follow up {follow up:15908} with ***   Signed, Alver Sorrow, NP 09/12/2020, 7:55 AM [facility]

## 2020-09-16 NOTE — Progress Notes (Signed)
Office Visit    Patient Name: Christina Avery Date of Encounter: 09/17/2020  Primary Care Provider:  Patient, No Pcp Per Primary Cardiologist:  Lorine Bears, MD Electrophysiologist:  None   Chief Complaint    Christina Avery is a 55 y.o. female with a hx of combined systolic and diastolic heart failure, hypertension, syncope, alcohol use, cocaine use, tobacco use, thyroid nodules presents today for overdue hospital follow up.   Past Medical History    Past Medical History:  Diagnosis Date  . Migraines    Past Surgical History:  Procedure Laterality Date  . CESAREAN SECTION    . CHOLECYSTECTOMY      Allergies  No Known Allergies  History of Present Illness    Christina Avery is a 55 y.o. female with a hx of combined systolic and diastolic heart failure, hypertension, syncope, alcohol use, cocaine use, tobacco use last seen while hospitalized.  Admitted 06/15/2020 after recurrent episodes of syncope.  Possibly vasovagal in the setting of polysubstance abuse.  Orthostatic vital signs while admitted were negative.  CT head with chronic appearing left basal ganglia lacunar infarct.  Carotid duplex without hemodynamically significant stenosis.  Echocardiogram showed LVEF 40 to 45%.  Telemetry monitoring with occasional PAC and PVC.  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  Ischemic evaluation was not recommended.  Of note, she had multiple thyroid nodules noted on CT scan recommended for follow-up outpatient ultrasound.  Presents today for follow up. Reports no recurrent episodes of syncope nor lightheadedness or dizziness. Reports some discomfort to her neck since the time of her hospitalization which she attributes to falling during syncopal episode.  She has not tried heat, ice, ibuprofen or Tylenol. She has not yet established with primary care provider at [facility] though provided with paperwork twice during her hospitalization. Reports  no shortness of breath nor dyspnea on exertion. Reports no chest pain, pressure, or tightness. No edema, orthopnea, PND. Reports no palpitations. She has reduced her smoking to less than 0.5 PPD. Reports not using cocaine nor drinking alcohols since her hospital discharge. She has been staying with her sister who is a [employer] fundraiser.   EKGs/Labs/Other Studies Reviewed:   The following studies were reviewed today:  Echo 06/17/20 1. Left ventricular ejection fraction, by estimation, is 40 to 45%. The  left ventricle has mildly decreased function. The left ventricle  demonstrates global hypokinesis. Left ventricular diastolic parameters are  consistent with Grade I diastolic  dysfunction (impaired relaxation).   2. Right ventricular systolic function is normal. The right ventricular  size is normal. There is mildly elevated pulmonary artery systolic  pressure. The estimated right ventricular systolic pressure is 35.9 mmHg.   3. Left atrial size was mildly dilated.   4. The mitral valve is abnormal. Moderate mitral valve regurgitation. No  evidence of mitral stenosis.   5. The aortic valve is normal in structure. Aortic valve regurgitation is  not visualized. Mild to moderate aortic valve sclerosis/calcification is  present, without any evidence of aortic stenosis.   6. The inferior vena cava is normal in size with greater than 50%  respiratory variability, suggesting right atrial pressure of 3 mmHg.    EKG:  EKG is ordered today.  The ekg ordered today demonstrates SR 94 bpm with occasioanl PACs, stable possible LA enlargement, left axis deviation, stable prolonged QT 515 ms. No acute ST/T wave changes.   Recent Labs: 06/15/2020: ALT 19; Magnesium 1.9; TSH 0.371 06/17/2020: BUN  9; Creatinine, Ser 0.82; Hemoglobin 13.8; Platelets 226; Potassium 3.7; Sodium 135  Recent Lipid Panel    Component Value Date/Time   CHOL 156 06/16/2020 0215   TRIG 53 06/16/2020 0215   HDL 59 06/16/2020 0215   CHOLHDL  2.6 06/16/2020 0215   VLDL 11 06/16/2020 0215   LDLCALC 86 06/16/2020 0215    Home Medications   Current Meds  Medication Sig  . atorvastatin (LIPITOR) 10 MG tablet Take 1 tablet (10 mg total) by mouth daily.  . carvedilol (COREG) 3.125 MG tablet Take 1 tablet (3.125 mg total) by mouth 2 (two) times daily with a meal.  . cyanocobalamin 100 MCG tablet Take 100 mcg by mouth daily.  . ferrous sulfate 325 (65 FE) MG tablet Take 325 mg by mouth daily with breakfast.  . losartan (COZAAR) 25 MG tablet Take 1 tablet (25 mg total) by mouth daily.  . Multiple Vitamins-Minerals (MULTIVITAMIN WITH MINERALS) tablet Take 1 tablet by mouth daily.    Review of Systems   Review of Systems  Constitutional: Negative for chills, fever and malaise/fatigue.  Cardiovascular: Negative for chest pain, dyspnea on exertion, leg swelling, near-syncope, orthopnea, palpitations and syncope.  Respiratory: Negative for cough, shortness of breath and wheezing.   Musculoskeletal: Positive for neck pain.  Gastrointestinal: Negative for nausea and vomiting.  Neurological: Negative for dizziness, light-headedness and weakness.   All other systems reviewed and are otherwise negative except as noted above.  Physical Exam    VS:  BP 110/78 (BP Location: Left Arm, Patient Position: Sitting, Cuff Size: Normal)   Pulse 94   Ht 6\' 3"  (1.905 m)   Wt 175 lb (79.4 kg)   SpO2 97%   BMI 21.87 kg/m  , BMI Body mass index is 21.87 kg/m.  Wt Readings from Last 3 Encounters:  09/17/20 175 lb (79.4 kg)  06/16/20 175 lb 11.3 oz (79.7 kg)  09/05/19 200 lb (90.7 kg)    GEN: Well nourished, well developed, in no acute distress. HEENT: normal. Neck: Supple, no JVD, carotid bruits, or masses. Cardiac: RRR, no murmurs, rubs, or gallops. No clubbing, cyanosis, edema.  Radials/DP/PT 2+ and equal bilaterally.  Respiratory:  Respirations regular and unlabored, clear to auscultation bilaterally. GI: Soft, nontender,  nondistended. MS: No deformity or atrophy. Skin: Warm and dry, no rash. Neuro:  Strength and sensation are intact. Psych: Normal affect.  Assessment & Plan    1. Syncope / Neck pain-previous syncope likely vasovagal in the setting of polysubstance abuse.  No recurrent syncope, lightheadedness, dizziness.  No indication for further evaluation at this time.  Does note some neck pain since syncope and fall.  Encourage utilization of heat, gentle stretching, acetaminophen, ibuprofen.  Encouraged to establish with primary care provider.  No neurological deficits noted on examination today.  2. Cardiomyopathy / Combined systolic and diastolic heart failure -euvolemic and well compensated on exam.  NYHA I.  GDMT includes,, losartan.  No indication for loop diuretic at this time.  LVEF by echo 06/17/2020 of 40-25%.  Plan for future ischemic evaluation when insurance obtain, as below.  Denies anginal symptoms.  No shortness of breath, edema, orthopnea, PND.  Cardiomyopathy likely related to previous substance abuse.  3. Polysubstance use-reports no alcohol nor cocaine since hospital discharge.  Smoking less than half pack per day.  Continued cessation of cocaine and alcohol encouraged.  Complete smoking cessation encouraged.  We discussed the role polysubstance use would have on her cardiomyopathy.  4. PAC/PVC-noted during hospitalization.  Occasional  PAC on EKG today.  Denies palpitations, lightheadedness, dizziness, near-syncope, syncope.  No indication for ZIO monitor at this time.  5. Prolonged QTc - Stable finding on EKG. BMP today to assess for contributory electrolyte abnormality.  6. Moderate MR-continue optimal BP and volume control.  Periodic echocardiogram for monitoring.  7. Coronary artery calcification on CT-reports no anginal symptoms.  EKG today with no acute ST/T wave changes.  LVEF 40-45% likely in setting of polysubstance use though would recommend ischemic evaluation.  She is without  insurance and tells me she cannot afford stress testing at this time.  Provided paperwork to apply for [place] financial assistance.  8. Hypertension- BP well controlled. Continue current antihypertensive regimen.  Including Coreg 6.25 mg twice daily and losartan 25 mg daily.  Refills provided. Given paperwork for medication management clinic to complete.  9. Hyperlipidemia-LDL goal less than 70 in the setting of coronary artery calcification.  Continue atorvastatin 10 mg daily, refill provided.  06/16/2020 LDL 86.  We will need to recheck lipid panel after 6 to 8 weeks of taking her atorvastatin consistently.  Consider checking at follow-up.  10. Thyroid nodule-noted on CT spine 06/2020 heterogenous thyroid with multiple hypodense nodules.  Recommended for thyroid ultrasound.  Encouraged to establish with PCP.  Previously referred to [facility].  11. Carotid artery stenosis -06/15/2020 carotid duplex with mild plaque in both carotid bulbs and proximal coronary to RCA.  Right ICA stenosis less than 50%.  No evidence of left ICA stenosis.  Continue atorvastatin.  Periodic imaging as needed.  Disposition: Follow up in 2 month(s) with Dr. Kirke Corin or APP.    Signed, Alver Sorrow, NP 09/17/2020, 11:01 AM [facility]

## 2020-09-17 ENCOUNTER — Encounter: Payer: Self-pay | Admitting: Family

## 2020-09-17 ENCOUNTER — Other Ambulatory Visit: Payer: Self-pay

## 2020-09-17 ENCOUNTER — Ambulatory Visit (INDEPENDENT_AMBULATORY_CARE_PROVIDER_SITE_OTHER): Payer: Self-pay | Admitting: Family

## 2020-09-17 VITALS — BP 110/78 | HR 94 | Ht 75.0 in | Wt 175.0 lb

## 2020-09-17 DIAGNOSIS — I429 Cardiomyopathy, unspecified: Secondary | ICD-10-CM

## 2020-09-17 DIAGNOSIS — I2510 Atherosclerotic heart disease of native coronary artery without angina pectoris: Secondary | ICD-10-CM

## 2020-09-17 DIAGNOSIS — I493 Ventricular premature depolarization: Secondary | ICD-10-CM

## 2020-09-17 DIAGNOSIS — F1910 Other psychoactive substance abuse, uncomplicated: Secondary | ICD-10-CM | POA: Insufficient documentation

## 2020-09-17 DIAGNOSIS — I340 Nonrheumatic mitral (valve) insufficiency: Secondary | ICD-10-CM | POA: Insufficient documentation

## 2020-09-17 DIAGNOSIS — I5042 Chronic combined systolic (congestive) and diastolic (congestive) heart failure: Secondary | ICD-10-CM | POA: Insufficient documentation

## 2020-09-17 DIAGNOSIS — I6521 Occlusion and stenosis of right carotid artery: Secondary | ICD-10-CM

## 2020-09-17 DIAGNOSIS — E782 Mixed hyperlipidemia: Secondary | ICD-10-CM

## 2020-09-17 DIAGNOSIS — E041 Nontoxic single thyroid nodule: Secondary | ICD-10-CM | POA: Insufficient documentation

## 2020-09-17 DIAGNOSIS — I2584 Coronary atherosclerosis due to calcified coronary lesion: Secondary | ICD-10-CM

## 2020-09-17 DIAGNOSIS — I491 Atrial premature depolarization: Secondary | ICD-10-CM | POA: Insufficient documentation

## 2020-09-17 DIAGNOSIS — R9431 Abnormal electrocardiogram [ECG] [EKG]: Secondary | ICD-10-CM | POA: Insufficient documentation

## 2020-09-17 MED ORDER — CARVEDILOL 3.125 MG PO TABS: 3.1250 mg | ORAL_TABLET | Freq: Two times a day (BID) | ORAL | 5 refills | Status: DC

## 2020-09-17 MED ORDER — LOSARTAN POTASSIUM 25 MG PO TABS: 25.0000 mg | ORAL_TABLET | Freq: Every day | ORAL | 5 refills | Status: DC

## 2020-09-17 MED ORDER — ATORVASTATIN CALCIUM 10 MG PO TABS: 10.0000 mg | ORAL_TABLET | Freq: Every day | ORAL | 5 refills | Status: DC

## 2020-09-17 NOTE — Patient Instructions (Signed)
Medication Instructions:  No medication changes today. Continue your current medications. We have sent refills to [facility].   *If you need a refill on your cardiac medications before your next appointment, please call your pharmacy*   Lab Work: Your provider recommends lab work today: CMP  If you have labs (blood work) drawn today and your tests are completely normal, you will receive your results only by: Marland Kitchen MyChart Message (if you have MyChart) OR . A paper copy in the mail If you have any lab test that is abnormal or we need to change your treatment, we will call you to review the results.   Testing/Procedures: Your EKG today looked stable compared to previous.    Follow-Up: At [facility], you and your health needs are our priority.  As part of our continuing mission to provide you with exceptional heart care, we have created designated Provider Care Teams.  These Care Teams include your primary Cardiologist (physician) and Advanced Practice Providers (APPs -  Physician Assistants and Nurse Practitioners) who all work together to provide you with the care you need, when you need it.  We recommend signing up for the patient portal called "MyChart".  Sign up information is provided on this After Visit Summary.  MyChart is used to connect with patients for Virtual Visits (Telemedicine).  Patients are able to view lab/test results, encounter notes, upcoming appointments, etc.  Non-urgent messages can be sent to your provider as well.   To learn more about what you can do with MyChart, go to [email or web address].    Your next appointment:   2 month(s)  The format for your next appointment:   In Person  Provider:   You may see Lorine Bears, MD or one of the following Advanced Practice Providers on your designated Care Team:    Nicolasa Ducking, NP  Eula Listen, PA-C  Marisue Ivan, PA-C  Cadence Fransico Michael, [place]  Gillian Shields, NP    Other Instructions  Heart Healthy  Diet Recommendations: A low-salt diet is recommended. Meats should be grilled, baked, or boiled. Avoid fried foods. Focus on lean protein sources like fish or chicken with vegetables and fruits. The American Heart Association is a Chief Technology Officer!  American Heart Association Diet and Lifeystyle Recommendations   Exercise recommendations: The American Heart Association recommends 150 minutes of moderate intensity exercise weekly. Try 30 minutes of moderate intensity exercise 4-5 times per week. This could include walking, jogging, or swimming.

## 2020-09-18 ENCOUNTER — Telehealth: Payer: Self-pay | Admitting: *Deleted

## 2020-09-18 LAB — COMPREHENSIVE METABOLIC PANEL
ALT: 25 IU/L (ref 0–32)
AST: 24 IU/L (ref 0–40)
Albumin/Globulin Ratio: 1.5 (ref 1.2–2.2)
Albumin: 4.3 g/dL (ref 3.8–4.9)
Alkaline Phosphatase: 102 IU/L (ref 44–121)
BUN/Creatinine Ratio: 13 (ref 9–23)
BUN: 12 mg/dL (ref 6–24)
Bilirubin Total: 1.3 mg/dL — ABNORMAL HIGH (ref 0.0–1.2)
CO2: 21 mmol/L (ref 20–29)
Calcium: 9.7 mg/dL (ref 8.7–10.2)
Chloride: 104 mmol/L (ref 96–106)
Creatinine, Ser: 0.93 mg/dL (ref 0.57–1.00)
GFR calc Af Amer: 82 mL/min/{1.73_m2} (ref >59)
GFR calc non Af Amer: 71 mL/min/{1.73_m2} (ref >59)
Globulin, Total: 2.9 g/dL (ref 1.5–4.5)
Glucose: 87 mg/dL (ref 65–99)
Potassium: 3.9 mmol/L (ref 3.5–5.2)
Sodium: 141 mmol/L (ref 134–144)
Total Protein: 7.2 g/dL (ref 6.0–8.5)

## 2020-09-18 NOTE — Telephone Encounter (Signed)
Attempted to call pt to give lab results. No answer. Unable to leave vm, mailbox is full.

## 2020-09-18 NOTE — Telephone Encounter (Signed)
-----   Message from Caitlin S Walker, NP sent at 09/18/2020  8:49 AM EST ----- Normal kidney function, liver function, electrolytes. Continue current medications.  

## 2020-09-26 NOTE — Telephone Encounter (Signed)
Attempted to call pt x 2. No answer. Unable to leave vm, mailbox is full.

## 2020-10-01 MED ORDER — CARVEDILOL 3.125 MG PO TABS: 3.1250 mg | ORAL_TABLET | Freq: Two times a day (BID) | ORAL | 5 refills | Status: DC

## 2020-10-01 MED ORDER — LOSARTAN POTASSIUM 25 MG PO TABS: 25.0000 mg | ORAL_TABLET | Freq: Every day | ORAL | 5 refills | Status: DC

## 2020-10-01 NOTE — Telephone Encounter (Signed)
-----   Message from Alver Sorrow, NP sent at 09/18/2020  8:49 AM EST ----- Normal kidney function, liver function, electrolytes. Continue current medications.

## 2020-10-01 NOTE — Telephone Encounter (Signed)
Spoke with patient and reviewed lab results. She states that she has not been able to pick up her medications at pharmacy. She also states it was sent to wrong one and requested they be sent into [facility] pharmacy. Patient states she is not able to afford medication and looked up both on [employer]rx[employer] site. Reviewed with patient that one is on the $4 list and the other one is $9. She inquired if there were any assistance with that and reviewed that only other assistance would be at Medication Management. She reports she has picked up application but has not completed that. She states she will try to pick those up at current cost and will work on the paperwork for them.

## 2020-11-15 ENCOUNTER — Ambulatory Visit: Payer: Self-pay | Admitting: Family

## 2020-11-18 ENCOUNTER — Encounter: Payer: Self-pay | Admitting: Family

## 2021-04-09 NOTE — Progress Notes (Deleted)
Cardiology Office Note:    Date:  04/09/2021   ID:  Christina Avery, DOB 07-05-1966, MRN [id number]  PCP:  Patient, No Pcp Per (Inactive)  [facility] Cardiologist:  Lorine Bears, MD  [facility] Electrophysiologist:  None   Referring MD: No ref. provider found   Chief Complaint: 2 month follow-up  History of Present Illness:    Christina Avery is a 55 y.o. female with a hx of combined systolic and diastolic heart failure, hypertension, syncope, alcohol use, cocaine use, tobacco use, thyroid nodules presents today for overdue follow-up.   Admitted 06/15/2020 after recurrent episodes of syncope.  Possibly vasovagal in the setting of polysubstance abuse.  Orthostatic vital signs while admitted were negative.  CT head with chronic appearing left basal ganglia lacunar infarct.  Carotid duplex without hemodynamically significant stenosis.  Echocardiogram showed LVEF 40 to 45%.  Telemetry monitoring with occasional PAC and PVC.  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  Ischemic evaluation was not recommended.  Of note, she had multiple thyroid nodules noted on CT scan recommended for follow-up outpatient ultrasound.  Last seen 09/17/20 and denied recurrent episodes of syncope. Due to LVEF 40-45% ischemic testing was recommended with stress test but patient was reluctant without insurance.   Today,  insurance  Past Medical History:  Diagnosis Date   Migraines     Past Surgical History:  Procedure Laterality Date   CESAREAN SECTION     CHOLECYSTECTOMY      Current Medications: No outpatient medications have been marked as taking for the 04/10/21 encounter (Appointment) with Fransico Michael, Jeda Pardue H, PA-C.     Allergies:   Patient has no known allergies.   Social History   Socioeconomic History   Marital status: Single    Spouse name: Not on file   Number of children: Not on file   Years of education: Not on file   Highest education level: Not on  file  Occupational History   Not on file  Tobacco Use   Smoking status: Every Day    Packs/day: 0.50    Types: Cigarettes   Smokeless tobacco: Never  Substance and Sexual Activity   Alcohol use: Yes    Comment: occ   Drug use: Yes    Types: Marijuana   Sexual activity: Yes    Birth control/protection: None  Other Topics Concern   Not on file  Social History Narrative   Not on file   Social Determinants of Health   Financial Resource Strain: Not on file  Food Insecurity: Not on file  Transportation Needs: Not on file  Physical Activity: Not on file  Stress: Not on file  Social Connections: Not on file     Family History: The patient's ***family history is not on file.  ROS:   Please see the history of present illness.    *** All other systems reviewed and are negative.  EKGs/Labs/Other Studies Reviewed:    The following studies were reviewed today:  Echo 06/17/20 1. Left ventricular ejection fraction, by estimation, is 40 to 45%. The  left ventricle has mildly decreased function. The left ventricle  demonstrates global hypokinesis. Left ventricular diastolic parameters are  consistent with Grade I diastolic  dysfunction (impaired relaxation).   2. Right ventricular systolic function is normal. The right ventricular  size is normal. There is mildly elevated pulmonary artery systolic  pressure. The estimated right ventricular systolic pressure is 35.9 mmHg.   3. Left atrial size was mildly  dilated.   4. The mitral valve is abnormal. Moderate mitral valve regurgitation. No  evidence of mitral stenosis.   5. The aortic valve is normal in structure. Aortic valve regurgitation is  not visualized. Mild to moderate aortic valve sclerosis/calcification is  present, without any evidence of aortic stenosis.   6. The inferior vena cava is normal in size with greater than 50%  respiratory variability, suggesting right atrial pressure of 3 mmHg.   EKG:  EKG is *** ordered  today.  The ekg ordered today demonstrates ***  Recent Labs: 06/15/2020: Magnesium 1.9; TSH 0.371 06/17/2020: Hemoglobin 13.8; Platelets 226 09/17/2020: ALT 25; BUN 12; Creatinine, Ser 0.93; Potassium 3.9; Sodium 141  Recent Lipid Panel    Component Value Date/Time   CHOL 156 06/16/2020 0215   TRIG 53 06/16/2020 0215   HDL 59 06/16/2020 0215   CHOLHDL 2.6 06/16/2020 0215   VLDL 11 06/16/2020 0215   LDLCALC 86 06/16/2020 0215     Risk Assessment/Calculations:   {Does this patient have ATRIAL FIBRILLATION?[phone]}   Physical Exam:    VS:  There were no vitals taken for this visit.    Wt Readings from Last 3 Encounters:  09/17/20 175 lb (79.4 kg)  06/16/20 175 lb 11.3 oz (79.7 kg)  09/05/19 200 lb (90.7 kg)     GEN: *** Well nourished, well developed in no acute distress HEENT: Normal NECK: No JVD; No carotid bruits LYMPHATICS: No lymphadenopathy CARDIAC: ***RRR, no murmurs, rubs, gallops RESPIRATORY:  Clear to auscultation without rales, wheezing or rhonchi  ABDOMEN: Soft, non-tender, non-distended MUSCULOSKELETAL:  No edema; No deformity  SKIN: Warm and dry NEUROLOGIC:  Alert and oriented x 3 PSYCHIATRIC:  Normal affect   ASSESSMENT:    No diagnosis found. PLAN:    In order of problems listed above:  Syncope  Cardiomyopathy Combined systolic and diastolic heart failure  Polysubstance abuse  PAC/PVC  Mod MR  Coronary calcification on CT  HTN  Disposition: Follow up {follow up:15908} with ***   Shared Decision Making/Informed Consent   {Are you ordering a CV Procedure (e.g. stress test, cath, DCCV, TEE, etc)?   Press F2        :[id number]}    Signed, Ramsey Midgett David Stall, PA-C  04/09/2021 3:56 PM    [facility]

## 2021-04-10 ENCOUNTER — Ambulatory Visit: Payer: Self-pay | Admitting: Medical

## 2021-04-11 ENCOUNTER — Encounter: Payer: Self-pay | Admitting: Medical

## 2021-04-18 ENCOUNTER — Emergency Department
Admission: EM | Admit: 2021-04-18 | Discharge: 2021-04-18 | Disposition: A | Discharge status: Home / Self Care | Payer: Medicaid Other | Attending: Emergency Medicine | Admitting: Emergency Medicine

## 2021-04-18 DIAGNOSIS — F17210 Nicotine dependence, cigarettes, uncomplicated: Secondary | ICD-10-CM | POA: Insufficient documentation

## 2021-04-18 DIAGNOSIS — R1013 Epigastric pain: Secondary | ICD-10-CM | POA: Insufficient documentation

## 2021-04-18 DIAGNOSIS — I2510 Atherosclerotic heart disease of native coronary artery without angina pectoris: Secondary | ICD-10-CM | POA: Insufficient documentation

## 2021-04-18 DIAGNOSIS — K5900 Constipation, unspecified: Secondary | ICD-10-CM | POA: Insufficient documentation

## 2021-04-18 DIAGNOSIS — Z79899 Other long term (current) drug therapy: Secondary | ICD-10-CM | POA: Insufficient documentation

## 2021-04-18 DIAGNOSIS — I5042 Chronic combined systolic (congestive) and diastolic (congestive) heart failure: Secondary | ICD-10-CM | POA: Insufficient documentation

## 2021-04-18 LAB — URINALYSIS, COMPLETE (UACMP) WITH MICROSCOPIC
Bacteria, UA: NONE SEEN
Bilirubin Urine: NEGATIVE
Glucose, UA: NEGATIVE mg/dL
Hgb urine dipstick: NEGATIVE
Ketones, ur: NEGATIVE mg/dL
Leukocytes,Ua: NEGATIVE
Nitrite: NEGATIVE
Protein, ur: 30 mg/dL — AB
Specific Gravity, Urine: 1.025 (ref 1.005–1.030)
pH: 5 (ref 5.0–8.0)

## 2021-04-18 LAB — COMPREHENSIVE METABOLIC PANEL
ALT: 17 U/L (ref 0–44)
AST: 22 U/L (ref 15–41)
Albumin: 3.5 g/dL (ref 3.5–5.0)
Alkaline Phosphatase: 85 U/L (ref 38–126)
Anion gap: 9 (ref 5–15)
BUN: 15 mg/dL (ref 6–20)
CO2: 27 mmol/L (ref 22–32)
Calcium: 9.4 mg/dL (ref 8.9–10.3)
Chloride: 102 mmol/L (ref 98–111)
Creatinine, Ser: 0.98 mg/dL (ref 0.44–1.00)
GFR, Estimated: >60 mL/min (ref >60)
Glucose, Bld: 107 mg/dL — ABNORMAL HIGH (ref 70–99)
Potassium: 4 mmol/L (ref 3.5–5.1)
Sodium: 138 mmol/L (ref 135–145)
Total Bilirubin: 1.1 mg/dL (ref 0.3–1.2)
Total Protein: 8 g/dL (ref 6.5–8.1)

## 2021-04-18 LAB — CBC
HCT: 40.4 % (ref 36.0–46.0)
Hemoglobin: 13.6 g/dL (ref 12.0–15.0)
MCH: 32.8 pg (ref 26.0–34.0)
MCHC: 33.7 g/dL (ref 30.0–36.0)
MCV: 97.3 fL (ref 80.0–100.0)
Platelets: 303 10*3/uL (ref 150–400)
RBC: 4.15 MIL/uL (ref 3.87–5.11)
RDW: 12.4 % (ref 11.5–15.5)
WBC: 6.7 10*3/uL (ref 4.0–10.5)
nRBC: 0 % (ref 0.0–0.2)

## 2021-04-18 LAB — LIPASE, BLOOD: Lipase: 38 U/L (ref 11–51)

## 2021-04-18 MED ORDER — SUCRALFATE 1 G PO TABS: 1.0000 g | ORAL_TABLET | Freq: Three times a day (TID) | ORAL | 0 refills | Status: DC

## 2021-04-18 MED ORDER — PANTOPRAZOLE SODIUM 40 MG PO TBEC: 40.0000 mg | DELAYED_RELEASE_TABLET | Freq: Every day | ORAL | 0 refills | Status: DC

## 2021-04-18 MED ORDER — PANTOPRAZOLE SODIUM 40 MG PO TBEC
40.0000 mg | DELAYED_RELEASE_TABLET | Freq: Once | ORAL | Status: AC
  Administered 2021-04-18: 40 mg via ORAL
  Filled 2021-04-18: qty 1

## 2021-04-18 MED ORDER — SUCRALFATE 1 G PO TABS
1.0000 g | ORAL_TABLET | Freq: Once | ORAL | Status: AC
  Administered 2021-04-18: 1 g via ORAL
  Filled 2021-04-18: qty 1

## 2021-04-18 MED ORDER — ALUM & MAG HYDROXIDE-SIMETH 200-200-20 MG/5ML PO SUSP
30.0000 mL | Freq: Once | ORAL | Status: AC
  Administered 2021-04-18: 30 mL via ORAL
  Filled 2021-04-18: qty 30

## 2021-04-18 NOTE — ED Triage Notes (Signed)
Pt here with abd pain x2 days. Pt states abd pain is all over and she was taking miralax with no relief. Pt states that she feels like she does not "burp enough" Pt denies N/V.

## 2021-04-18 NOTE — ED Provider Notes (Signed)
[facility] Emergency Department Provider Note  ____________________________________________   Event Date/Time   First MD Initiated Contact with Patient 04/18/21 1057     (approximate)  I have reviewed the triage vital signs and the nursing notes.   HISTORY  Chief Complaint Abdominal Pain   HPI Christina Avery is a 55 y.o. female with a past medical history of CAD, CHF, PVCs, HDL and migraine headaches as well as tobacco abuse who presents for assessment of 2 to 3 weeks of epigastric abdominal pain that seems to occur whenever she eats.  Patient states she has been taking some Goody powders but did not help much.  She denies any significant NSAID or EtOH use.  States she currently has a little bit of discomfort when someone pushes on her abdomen but she really only hurts after eating.  She states she felt a little bit constipated to 3 weeks of epigastric abdominal pain that seems to occur whenever she eats.  Patient states she has been taking some Goody powders but is not helped much.  She denies any significant NSAID or EtOH use.  He states she currently has a little bit of discomfort when someone pushes on her abdomen but she really only hurts after eating.  She states she felt a little bit constipated yesterday and took MiraLAX and had a bowel movement but otherwise has not had significant constipation or diarrhea.  She has not had any significant chest pain, cough, fevers, shortness of breath, urinary symptoms, rash or recent injuries or falls.  Denies any known history of ulcers or gastritis.  No other acute concerns at this time.         Past Medical History:  Diagnosis Date   Migraines     Patient Active Problem List   Diagnosis Date Noted   Chronic combined systolic and diastolic CHF (congestive heart failure) (HCC) 09/17/2020   Coronary artery calcification 09/17/2020   Cardiomyopathy (HCC) 09/17/2020   Polysubstance abuse (HCC) 09/17/2020   PAC  (premature atrial contraction) 09/17/2020   PVC (premature ventricular contraction) 09/17/2020   Prolonged Q-T interval on ECG 09/17/2020   Nonrheumatic mitral valve regurgitation 09/17/2020   Mixed hyperlipidemia 09/17/2020   Stenosis of right carotid artery 09/17/2020   Thyroid nodule 09/17/2020   Syncope and collapse 06/15/2020   Chest pain in adult 06/15/2020   Migraines    Transaminitis 09/10/2019   History of cocaine use 09/22/2018   Tobacco use disorder 10/10/2014    Past Surgical History:  Procedure Laterality Date   CESAREAN SECTION     CHOLECYSTECTOMY      Prior to Admission medications   Medication Sig Start Date End Date Taking? Authorizing Provider  pantoprazole (PROTONIX) 40 MG tablet Take 1 tablet (40 mg total) by mouth daily. 04/18/21 05/18/21 Yes Gilles Chiquito, MD  sucralfate (CARAFATE) 1 g tablet Take 1 tablet (1 g total) by mouth 4 (four) times daily -  with meals and at bedtime for 7 days. 04/18/21 04/25/21 Yes Gilles Chiquito, MD  atorvastatin (LIPITOR) 10 MG tablet Take 1 tablet (10 mg total) by mouth daily. 09/17/20 03/16/21  Alver Sorrow, NP  carvedilol (COREG) 3.125 MG tablet Take 1 tablet (3.125 mg total) by mouth 2 (two) times daily with a meal. 10/01/20 03/30/21  Alver Sorrow, NP  cyanocobalamin 100 MCG tablet Take 100 mcg by mouth daily.    [provider]  ferrous sulfate 325 (65 FE) MG tablet Take 325 mg by  mouth daily with breakfast.    [provider]  losartan (COZAAR) 25 MG tablet Take 1 tablet (25 mg total) by mouth daily. 10/01/20 03/30/21  Alver Sorrow, NP  Multiple Vitamins-Minerals (MULTIVITAMIN WITH MINERALS) tablet Take 1 tablet by mouth daily.    [provider]  thiamine (VITAMIN B-1) 100 MG tablet TAKE ONE TABLET BY MOUTH EVERY DAY 06/18/20 06/18/21  Lynn Ito, MD    Allergies Patient has no known allergies.  No family history on file.  Social History Social History   Tobacco Use   Smoking  status: Every Day    Packs/day: 0.50    Types: Cigarettes   Smokeless tobacco: Never  Substance Use Topics   Alcohol use: Yes    Comment: occ   Drug use: Yes    Types: Marijuana    Review of Systems  Review of Systems  Constitutional:  Negative for chills and fever.  HENT:  Negative for sore throat.   Eyes:  Negative for pain.  Respiratory:  Negative for cough and stridor.   Cardiovascular:  Negative for chest pain.  Gastrointestinal:  Positive for abdominal pain. Negative for vomiting.  Skin:  Negative for rash.  Neurological:  Negative for seizures, loss of consciousness and headaches.  Psychiatric/Behavioral:  Negative for suicidal ideas.   All other systems reviewed and are negative.    ____________________________________________   PHYSICAL EXAM:  VITAL SIGNS: ED Triage Vitals [04/18/21 1023]  Enc Vitals Group     BP (!) 129/113     Pulse Rate 85     Resp 18     Temp 98.3 F (36.8 C)     Temp Source Oral     SpO2 100 %     Weight 186 lb (84.4 kg)     Height 6\' 3"  (1.905 m)     Head Circumference      Peak Flow      Pain Score 9     Pain Loc      Pain Edu?      Excl. in GC?    Vitals:   04/18/21 1023  BP: (!) 129/113  Pulse: 85  Resp: 18  Temp: 98.3 F (36.8 C)  SpO2: 100%   Physical Exam Vitals and nursing note reviewed.  Constitutional:      General: She is not in acute distress.    Appearance: She is well-developed.  HENT:     Head: Normocephalic and atraumatic.     Right Ear: External ear normal.     Left Ear: External ear normal.     Nose: Nose normal.  Eyes:     Conjunctiva/sclera: Conjunctivae normal.  Cardiovascular:     Rate and Rhythm: Normal rate and regular rhythm.     Heart sounds: No murmur heard. Pulmonary:     Effort: Pulmonary effort is normal. No respiratory distress.     Breath sounds: Normal breath sounds.  Abdominal:     Palpations: Abdomen is soft.     Tenderness: There is abdominal tenderness in the epigastric  area. There is no right CVA tenderness or left CVA tenderness.  Musculoskeletal:     Cervical back: Neck supple.  Skin:    General: Skin is warm and dry.  Neurological:     Mental Status: She is alert and oriented to person, place, and time.  Psychiatric:        Mood and Affect: Mood normal.     ____________________________________________   LABS (all labs ordered are  listed, but only abnormal results are displayed)  Labs Reviewed  COMPREHENSIVE METABOLIC PANEL - Abnormal; Notable for the following components:      Result Value   Glucose, Bld 107 (*)    All other components within normal limits  URINALYSIS, COMPLETE (UACMP) WITH MICROSCOPIC - Abnormal; Notable for the following components:   Color, Urine YELLOW (*)    APPearance HAZY (*)    Protein, ur 30 (*)    All other components within normal limits  LIPASE, BLOOD  CBC  POC URINE PREG, ED   ____________________________________________  EKG  ____________________________________________  RADIOLOGY  ED MD interpretation:   Official radiology report(s): No results found.  ____________________________________________   PROCEDURES  Procedure(s) performed (including Critical Care):  Procedures   ____________________________________________   INITIAL IMPRESSION / ASSESSMENT AND PLAN / ED COURSE      Patient presents with above-stated history exam for assessment of several weeks of some intermittent epigastric abdominal pain associated with meals.  She has been taking Goody powders for this without significant help.  On arrival she is afebrile hemodynamically stable.  He does have some mild epigastric tenderness but no significant right upper quadrant or left upper quadrant tenderness.  Lower abdomen is soft throughout.  There is no CVA tenderness.  I suspect likely peptic ulcer disease versus some gastritis.  No significant right upper quadrant tenderness to suggest acute cholecystitis at this time.  There is  no lower abdominal tenderness or history of clear obstipation to suggest appendicitis, diverticulitis or SBO.  CMP shows no evidence of cholestasis or hepatitis.  No other significant electrolyte or metabolic arrangements.  Lipase not consistent with pancreatitis.  UA is not consistent with acute cystitis and I have a low suspicion for pyelonephritis.  Will treat with GI cocktail noted below and start patient on Protonix.  Advised patient to avoid Goody powders NSAIDs alcohol and acidic foods follow-up with PCP.  She is amenable with plan.  Discharged stable condition.  Strict return precautions advised discussed.          ____________________________________________   FINAL CLINICAL IMPRESSION(S) / ED DIAGNOSES  Final diagnoses:  Epigastric pain    Medications  pantoprazole (PROTONIX) EC tablet 40 mg (40 mg Oral Given 04/18/21 1122)  sucralfate (CARAFATE) tablet 1 g (1 g Oral Given 04/18/21 1122)  alum & mag hydroxide-simeth (MAALOX/MYLANTA) 200-200-20 MG/5ML suspension 30 mL (30 mLs Oral Given 04/18/21 1122)     ED Discharge Orders          Ordered    pantoprazole (PROTONIX) 40 MG tablet  Daily        04/18/21 1133    sucralfate (CARAFATE) 1 g tablet  3 times daily with meals & bedtime        04/18/21 1133             Note:  This document was prepared using Dragon voice recognition software and may include unintentional dictation errors.    Gilles Chiquito, MD 04/18/21 [phone]

## 2021-08-27 ENCOUNTER — Other Ambulatory Visit: Payer: Self-pay

## 2021-08-28 ENCOUNTER — Other Ambulatory Visit: Payer: Self-pay

## 2021-08-28 MED ORDER — CARVEDILOL 12.5 MG PO TABS
12.5000 mg | ORAL_TABLET | Freq: Two times a day (BID) | ORAL | 3 refills | Status: DC
  Filled 2021-08-28: qty 60, 30d supply, fill #0

## 2021-08-28 MED ORDER — LOSARTAN POTASSIUM 25 MG PO TABS
25.0000 mg | ORAL_TABLET | ORAL | 0 refills | Status: DC
  Filled 2021-08-28: qty 30, 30d supply, fill #0

## 2021-08-28 MED ORDER — SPIRONOLACTONE 25 MG PO TABS
12.5000 mg | ORAL_TABLET | Freq: Every day | ORAL | 3 refills | Status: DC
  Filled 2021-08-28: qty 15, 30d supply, fill #0

## 2021-08-29 ENCOUNTER — Other Ambulatory Visit: Payer: Self-pay

## 2021-09-02 ENCOUNTER — Other Ambulatory Visit: Payer: Self-pay

## 2021-09-10 NOTE — Congregational Nurse Program (Signed)
°  Dept: [phone]   Congregational Nurse Program Note  Date of Encounter: 09/10/2021 RN assisted client with making her first appointment at the [facility]. Appointment made for 2/9 at 2:45 pm. She will take the [facility] bus. She has obtained her medications from Medication management. She also reports she will be starting a job at [employer]. Transportation will be provided by her sister. No other concerns at this time. Past Medical History: Past Medical History:  Diagnosis Date   Migraines     Encounter Details:  CNP Questionnaire - 09/10/21 1130       Questionnaire   Do you give verbal consent to treat you today? Yes    Location Patient [place] or Organization    Patient Status Homeless   currently   Insurance Uninsured ([employer]/Self-Pay)    Insurance Referral N/A    Medication N/A   clieint has obtained her medication from medication management   Medical Provider Yes   first appointment at [facility] on 2/9 at 2:45 pm   Screening Referrals N/A    Medical Referral N/A   referral to [facility] made last week and Rn delivered application on [id number]   Medical Appointment Made [facility] PCP/clinic   [facility] 2/9 2:45pm   Food N/A   client is curently staying at the shelter   Transportation N/A   client si using the [employer] bus system   Housing/Utilities No permanent housing   currenlty staying at the [employer]   Interpersonal Safety N/A    Intervention Case Management    ED Visit Averted N/A    Life-Saving Intervention Made N/A

## 2021-09-11 ENCOUNTER — Ambulatory Visit: Payer: Self-pay | Admitting: Gerontology

## 2021-09-18 ENCOUNTER — Ambulatory Visit: Payer: Medicaid Other

## 2021-10-03 ENCOUNTER — Telehealth: Payer: Self-pay | Admitting: Pharmacist

## 2021-10-03 NOTE — Telephone Encounter (Signed)
Patient has prescription drug coverage. No longer meets the eligibility requirements to obtain medication assistance from [facility]. Patient notified by letter.  ? ?Christina Avery ?Medication Management ?

## 2021-10-09 ENCOUNTER — Other Ambulatory Visit: Payer: Self-pay

## 2022-02-25 ENCOUNTER — Other Ambulatory Visit: Payer: Self-pay

## 2022-02-25 MED ORDER — SPIRONOLACTONE 25 MG PO TABS
ORAL_TABLET | ORAL | 2 refills | Status: DC
  Filled 2022-02-25: qty 90, 90d supply, fill #0

## 2022-02-27 ENCOUNTER — Other Ambulatory Visit: Payer: Self-pay

## 2022-03-26 ENCOUNTER — Other Ambulatory Visit: Payer: Self-pay

## 2022-03-26 ENCOUNTER — Encounter: Payer: Self-pay | Admitting: Gerontology

## 2022-03-26 ENCOUNTER — Ambulatory Visit: Payer: Medicaid Other | Admitting: Gerontology

## 2022-03-26 VITALS — BP 97/63 | HR 75 | Temp 98.1°F | Ht 73.0 in | Wt 163.8 lb

## 2022-03-26 DIAGNOSIS — K0889 Other specified disorders of teeth and supporting structures: Secondary | ICD-10-CM

## 2022-03-26 DIAGNOSIS — Z7689 Persons encountering health services in other specified circumstances: Secondary | ICD-10-CM

## 2022-03-26 DIAGNOSIS — I6521 Occlusion and stenosis of right carotid artery: Secondary | ICD-10-CM

## 2022-03-26 DIAGNOSIS — E041 Nontoxic single thyroid nodule: Secondary | ICD-10-CM

## 2022-03-26 DIAGNOSIS — Z8679 Personal history of other diseases of the circulatory system: Secondary | ICD-10-CM

## 2022-03-26 MED ORDER — ATORVASTATIN CALCIUM 10 MG PO TABS
10.0000 mg | ORAL_TABLET | Freq: Every day | ORAL | 0 refills | Status: DC
Start: 2022-03-26 — End: 2022-05-19
  Filled 2022-03-26: qty 30, 30d supply, fill #0

## 2022-03-26 MED ORDER — LOSARTAN POTASSIUM 25 MG PO TABS
25.0000 mg | ORAL_TABLET | ORAL | 0 refills | Status: DC
  Filled 2022-03-26: qty 30, 30d supply, fill #0

## 2022-03-26 MED ORDER — BLOOD PRESSURE KIT
1.0000 | PACK | Freq: Every day | 0 refills | Status: DC
  Filled 2022-03-26: qty 1, fill #0

## 2022-03-26 MED ORDER — CARVEDILOL 3.125 MG PO TABS
3.1250 mg | ORAL_TABLET | Freq: Two times a day (BID) | ORAL | 0 refills | Status: DC
  Filled 2022-03-26: qty 60, 30d supply, fill #0
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
## 2022-03-26 NOTE — Patient Instructions (Signed)

## 2022-04-01 ENCOUNTER — Ambulatory Visit: Payer: Self-pay | Admitting: Gerontology

## 2022-04-01 ENCOUNTER — Other Ambulatory Visit: Payer: Medicaid Other

## 2022-04-08 ENCOUNTER — Ambulatory Visit: Payer: Medicaid Other | Admitting: Gerontology

## 2022-04-22 ENCOUNTER — Ambulatory Visit: Payer: Self-pay | Admitting: Gerontology

## 2022-05-13 ENCOUNTER — Other Ambulatory Visit: Payer: Medicaid Other

## 2022-05-13 DIAGNOSIS — Z7689 Persons encountering health services in other specified circumstances: Secondary | ICD-10-CM

## 2022-05-14 ENCOUNTER — Other Ambulatory Visit: Payer: Self-pay | Admitting: Emergency Medicine

## 2022-05-14 ENCOUNTER — Other Ambulatory Visit: Payer: Self-pay

## 2022-05-14 ENCOUNTER — Ambulatory Visit: Payer: Self-pay | Admitting: Adult Health

## 2022-05-14 DIAGNOSIS — Z8679 Personal history of other diseases of the circulatory system: Secondary | ICD-10-CM

## 2022-05-14 LAB — SPECIMEN STATUS REPORT

## 2022-05-14 LAB — HEMOGLOBIN A1C
Est. average glucose Bld gHb Est-mCnc: 91 mg/dL
Hgb A1c MFr Bld: 4.8 % (ref 4.8–5.6)

## 2022-05-14 MED ORDER — LOSARTAN POTASSIUM 25 MG PO TABS
25.0000 mg | ORAL_TABLET | ORAL | 0 refills | Status: DC
  Filled 2022-05-14: qty 30, 30d supply, fill #0

## 2022-05-19 ENCOUNTER — Telehealth: Payer: Self-pay | Admitting: Emergency Medicine

## 2022-05-19 ENCOUNTER — Other Ambulatory Visit: Payer: Self-pay

## 2022-05-19 ENCOUNTER — Ambulatory Visit: Payer: Medicaid Other | Admitting: Gerontology

## 2022-05-19 ENCOUNTER — Encounter: Payer: Self-pay | Admitting: Gerontology

## 2022-05-19 VITALS — BP 144/91 | HR 84 | Temp 97.5°F | Resp 16 | Ht 75.0 in | Wt 172.0 lb

## 2022-05-19 DIAGNOSIS — Z8679 Personal history of other diseases of the circulatory system: Secondary | ICD-10-CM

## 2022-05-19 DIAGNOSIS — I6521 Occlusion and stenosis of right carotid artery: Secondary | ICD-10-CM

## 2022-05-19 DIAGNOSIS — M25512 Pain in left shoulder: Secondary | ICD-10-CM | POA: Insufficient documentation

## 2022-05-19 DIAGNOSIS — R944 Abnormal results of kidney function studies: Secondary | ICD-10-CM

## 2022-05-19 DIAGNOSIS — G8929 Other chronic pain: Secondary | ICD-10-CM

## 2022-05-19 MED ORDER — LIDOCAINE 5 % EX PTCH
1.0000 | MEDICATED_PATCH | CUTANEOUS | 0 refills | Status: DC
  Filled 2022-05-19: qty 30, 30d supply, fill #0

## 2022-05-19 MED ORDER — ATORVASTATIN CALCIUM 10 MG PO TABS
10.0000 mg | ORAL_TABLET | Freq: Every day | ORAL | 1 refills | Status: DC
  Filled 2022-05-19: qty 30, 30d supply, fill #0

## 2022-05-19 MED ORDER — CARVEDILOL 3.125 MG PO TABS
3.1250 mg | ORAL_TABLET | Freq: Two times a day (BID) | ORAL | 2 refills | Status: DC
  Filled 2022-05-19: qty 60, 30d supply, fill #0

## 2022-05-19 MED ORDER — LOSARTAN POTASSIUM 25 MG PO TABS
25.0000 mg | ORAL_TABLET | ORAL | 1 refills | Status: DC
  Filled 2022-05-19: qty 90, fill #0

## 2022-05-19 NOTE — Patient Instructions (Signed)
Shoulder Pain Many things can cause shoulder pain, including: An injury to the shoulder. Overuse of the shoulder. Arthritis. The source of the pain can be: Inflammation. An injury to the shoulder joint. An injury to a tendon, ligament, or bone. Follow these instructions at home: Pay attention to changes in your symptoms. Let your health care provider know about them. Follow these instructions to relieve your pain. If you have a sling: Wear the sling as told by your health care provider. Remove it only as told by your health care provider. Loosen the sling if your fingers tingle, become numb, or turn cold and blue. Keep the sling clean. If the sling is not waterproof: Do not let it get wet. Remove it to shower or bathe. Move your arm as little as possible, but keep your hand moving to prevent swelling. Managing pain, stiffness, and swelling  If directed, put ice on the painful area: Put ice in a plastic bag. Place a towel between your skin and the bag. Leave the ice on for 20 minutes, 2-3 times per day. Stop applying ice if it does not help with the pain. Squeeze a soft ball or a foam pad as much as possible. This helps to keep the shoulder from swelling. It also helps to strengthen the arm. General instructions Take over-the-counter and prescription medicines only as told by your health care provider. Keep all follow-up visits as told by your health care provider. This is important. Contact a health care provider if: Your pain gets worse. Your pain is not relieved with medicines. New pain develops in your arm, hand, or fingers. Get help right away if: Your arm, hand, or fingers: Tingle. Become numb. Become swollen. Become painful. Turn white or blue. Summary Shoulder pain can be caused by an injury, overuse, or arthritis. Pay attention to changes in your symptoms. Let your health care provider know about them. This condition may be treated with a sling, ice, and pain  medicines. Contact your health care provider if the pain gets worse or new pain develops. Get help right away if your arm, hand, or fingers tingle or become numb, swollen, or painful. Keep all follow-up visits as told by your health care provider. This is important. This information is not intended to replace advice given to you by your health care provider. Make sure you discuss any questions you have with your health care provider. Document Revised: 04/04/2021 Document Reviewed: 04/04/2021 Elsevier Patient Education  [place] Eating Plan DASH stands for Dietary Approaches to Stop Hypertension. The DASH eating plan is a healthy eating plan that has been shown to: Reduce high blood pressure (hypertension). Reduce your risk for type 2 diabetes, heart disease, and stroke. Help with weight loss. What are tips for following this plan? Reading food labels Check food labels for the amount of salt (sodium) per serving. Choose foods with less than 5 percent of the Daily Value of sodium. Generally, foods with less than 300 milligrams (mg) of sodium per serving fit into this eating plan. To find whole grains, look for the word "whole" as the first word in the ingredient list. Shopping Buy products labeled as "low-sodium" or "no salt added." Buy fresh foods. Avoid canned foods and pre-made or frozen meals. Cooking Avoid adding salt when cooking. Use salt-free seasonings or herbs instead of table salt or sea salt. Check with your health care provider or pharmacist before using salt substitutes. Do not fry foods. Cook foods using healthy methods such  as baking, boiling, grilling, roasting, and broiling instead. Cook with heart-healthy oils, such as olive, canola, avocado, soybean, or sunflower oil. Meal planning  Eat a balanced diet that includes: 4 or more servings of fruits and 4 or more servings of vegetables each day. Try to fill one-half of your plate with fruits and  vegetables. 6-8 servings of whole grains each day. Less than 6 oz (170 g) of lean meat, poultry, or fish each day. A 3-oz (85-g) serving of meat is about the same size as a deck of cards. One egg equals 1 oz (28 g). 2-3 servings of low-fat dairy each day. One serving is 1 cup (237 mL). 1 serving of nuts, seeds, or beans 5 times each week. 2-3 servings of heart-healthy fats. Healthy fats called omega-3 fatty acids are found in foods such as walnuts, flaxseeds, fortified milks, and eggs. These fats are also found in cold-water fish, such as sardines, salmon, and mackerel. Limit how much you eat of: Canned or prepackaged foods. Food that is high in trans fat, such as some fried foods. Food that is high in saturated fat, such as fatty meat. Desserts and other sweets, sugary drinks, and other foods with added sugar. Full-fat dairy products. Do not salt foods before eating. Do not eat more than 4 egg yolks a week. Try to eat at least 2 vegetarian meals a week. Eat more home-cooked food and less restaurant, buffet, and fast food. Lifestyle When eating at a restaurant, ask that your food be prepared with less salt or no salt, if possible. If you drink alcohol: Limit how much you use to: 0-1 drink a day for women who are not pregnant. 0-2 drinks a day for men. Be aware of how much alcohol is in your drink. In the [place] one drink equals one 12 oz bottle of beer (355 mL), one 5 oz glass of wine (148 mL), or one 1 oz glass of hard liquor (44 mL). General information Avoid eating more than 2,300 mg of salt a day. If you have hypertension, you may need to reduce your sodium intake to 1,500 mg a day. Work with your health care provider to maintain a healthy body weight or to lose weight. Ask what an ideal weight is for you. Get at least 30 minutes of exercise that causes your heart to beat faster (aerobic exercise) most days of the week. Activities may include walking, swimming, or biking. Work with  your health care provider or dietitian to adjust your eating plan to your individual calorie needs. What foods should I eat? Fruits All fresh, dried, or frozen fruit. Canned fruit in natural juice (without added sugar). Vegetables Fresh or frozen vegetables (raw, steamed, roasted, or grilled). Low-sodium or reduced-sodium tomato and vegetable juice. Low-sodium or reduced-sodium tomato sauce and tomato paste. Low-sodium or reduced-sodium canned vegetables. Grains Whole-grain or whole-wheat bread. Whole-grain or whole-wheat pasta. Brown rice. [place]. Bulgur. Whole-grain and low-sodium cereals. Pita bread. Low-fat, low-sodium crackers. Whole-wheat flour tortillas. Meats and other proteins Skinless chicken or [place]. Ground chicken or [place]. Pork with fat trimmed off. Fish and seafood. Egg whites. Dried beans, peas, or lentils. Unsalted nuts, nut butters, and seeds. Unsalted canned beans. Lean cuts of beef with fat trimmed off. Low-sodium, lean precooked or cured meat, such as sausages or meat loaves. Dairy Low-fat (1%) or fat-free (skim) milk. Reduced-fat, low-fat, or fat-free cheeses. Nonfat, low-sodium ricotta or cottage cheese. Low-fat or nonfat yogurt. Low-fat, low-sodium cheese. Fats and oils Soft margarine without trans  fats. Vegetable oil. Reduced-fat, low-fat, or light mayonnaise and salad dressings (reduced-sodium). Canola, safflower, olive, avocado, soybean, and sunflower oils. Avocado. Seasonings and condiments Herbs. Spices. Seasoning mixes without salt. Other foods Unsalted popcorn and pretzels. Fat-free sweets. The items listed above may not be a complete list of foods and beverages you can eat. Contact a dietitian for more information. What foods should I avoid? Fruits Canned fruit in a light or heavy syrup. Fried fruit. Fruit in cream or butter sauce. Vegetables Creamed or fried vegetables. Vegetables in a cheese sauce. Regular canned vegetables (not low-sodium or  reduced-sodium). Regular canned tomato sauce and paste (not low-sodium or reduced-sodium). Regular tomato and vegetable juice (not low-sodium or reduced-sodium). Angie Fava. Olives. Grains Baked goods made with fat, such as croissants, muffins, or some breads. Dry pasta or rice meal packs. Meats and other proteins Fatty cuts of meat. Ribs. Fried meat. Berniece Salines. Bologna, salami, and other precooked or cured meats, such as sausages or meat loaves. Fat from the back of a pig (fatback). Bratwurst. Salted nuts and seeds. Canned beans with added salt. Canned or smoked fish. Whole eggs or egg yolks. Chicken or [place] with skin. Dairy Whole or 2% milk, cream, and half-and-half. Whole or full-fat cream cheese. Whole-fat or sweetened yogurt. Full-fat cheese. Nondairy creamers. Whipped toppings. Processed cheese and cheese spreads. Fats and oils Butter. Stick margarine. Lard. Shortening. Ghee. Bacon fat. Tropical oils, such as coconut, palm kernel, or palm oil. Seasonings and condiments Onion salt, garlic salt, seasoned salt, table salt, and sea salt. Worcestershire sauce. Tartar sauce. Barbecue sauce. Teriyaki sauce. Soy sauce, including reduced-sodium. Steak sauce. Canned and packaged gravies. Fish sauce. Oyster sauce. Cocktail sauce. Store-bought horseradish. Ketchup. Mustard. Meat flavorings and tenderizers. Bouillon cubes. Hot sauces. Pre-made or packaged marinades. Pre-made or packaged taco seasonings. Relishes. Regular salad dressings. Other foods Salted popcorn and pretzels. The items listed above may not be a complete list of foods and beverages you should avoid. Contact a dietitian for more information. Where to find more information National Heart, Lung, and Blood Institute: https://wilson-eaton.com/ American Heart Association: www.heart.org Academy of Nutrition and Dietetics: www.eatright.[place]: www.kidney.org Summary The DASH eating plan is a healthy eating plan that has been shown  to reduce high blood pressure (hypertension). It may also reduce your risk for type 2 diabetes, heart disease, and stroke. When on the DASH eating plan, aim to eat more fresh fruits and vegetables, whole grains, lean proteins, low-fat dairy, and heart-healthy fats. With the DASH eating plan, you should limit salt (sodium) intake to 2,300 mg a day. If you have hypertension, you may need to reduce your sodium intake to 1,500 mg a day. Work with your health care provider or dietitian to adjust your eating plan to your individual calorie needs. This information is not intended to replace advice given to you by your health care provider. Make sure you discuss any questions you have with your health care provider. Document Revised: 06/23/2019 Document Reviewed: 06/23/2019 Elsevier Patient Education  [place].
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
## 2022-05-19 NOTE — Telephone Encounter (Signed)
Called and spoke with patient re: active insurance. [facility] pharmacy advised that patient has active [employer]. Received prior auth request from pharmacy for Lidocaine 5% patches. Advised pharmacy to cancel Lidocaine prescription since we would not be doing a prior auth. OK to fill other medications as prescribed at today's visit.  Advised patient of above and that today would be her last visit at [facility] since she has active insurance. Advised to find another PCP as soon as possible to continue her care. Wished patient well. Patient agreed and verbalized understanding.

## 2022-05-20 ENCOUNTER — Other Ambulatory Visit: Payer: Self-pay

## 2022-06-02 ENCOUNTER — Other Ambulatory Visit: Payer: Self-pay

## 2022-06-05 LAB — CBC WITH DIFFERENTIAL/PLATELET
Basophils Absolute: 0 10*3/uL (ref 0.0–0.2)
Basos: 1 %
EOS (ABSOLUTE): 0.1 10*3/uL (ref 0.0–0.4)
Eos: 2 %
Hematocrit: 39.4 % (ref 34.0–46.6)
Hemoglobin: 12.9 g/dL (ref 11.1–15.9)
Immature Grans (Abs): 0 10*3/uL (ref 0.0–0.1)
Immature Granulocytes: 0 %
Lymphocytes Absolute: 1.9 10*3/uL (ref 0.7–3.1)
Lymphs: 34 %
MCH: 33 pg (ref 26.6–33.0)
MCHC: 32.7 g/dL (ref 31.5–35.7)
MCV: 101 fL — ABNORMAL HIGH (ref 79–97)
Monocytes Absolute: 0.4 10*3/uL (ref 0.1–0.9)
Monocytes: 8 %
Neutrophils Absolute: 3.1 10*3/uL (ref 1.4–7.0)
Neutrophils: 55 %
Platelets: 247 10*3/uL (ref 150–450)
RBC: 3.91 x10E6/uL (ref 3.77–5.28)
RDW: 11.2 % — ABNORMAL LOW (ref 11.7–15.4)
WBC: 5.6 10*3/uL (ref 3.4–10.8)

## 2022-06-05 LAB — COMPREHENSIVE METABOLIC PANEL
ALT: 18 IU/L (ref 0–32)
AST: 18 IU/L (ref 0–40)
Albumin/Globulin Ratio: 1.3 (ref 1.2–2.2)
Albumin: 3.5 g/dL — ABNORMAL LOW (ref 3.8–4.9)
Alkaline Phosphatase: 87 IU/L (ref 44–121)
BUN/Creatinine Ratio: 9 (ref 9–23)
BUN: 11 mg/dL (ref 6–24)
Bilirubin Total: 0.5 mg/dL (ref 0.0–1.2)
CO2: 23 mmol/L (ref 20–29)
Calcium: 9.3 mg/dL (ref 8.7–10.2)
Chloride: 108 mmol/L — ABNORMAL HIGH (ref 96–106)
Creatinine, Ser: 1.18 mg/dL — ABNORMAL HIGH (ref 0.57–1.00)
Globulin, Total: 2.7 g/dL (ref 1.5–4.5)
Glucose: 91 mg/dL (ref 70–99)
Potassium: 3.9 mmol/L (ref 3.5–5.2)
Sodium: 142 mmol/L (ref 134–144)
Total Protein: 6.2 g/dL (ref 6.0–8.5)
eGFR: 55 mL/min/{1.73_m2} — ABNORMAL LOW (ref >59)

## 2022-06-05 LAB — SPECIMEN STATUS REPORT

## 2022-06-05 LAB — LIPID PANEL W/O CHOL/HDL RATIO
Cholesterol, Total: 195 mg/dL (ref 100–199)
HDL: 58 mg/dL (ref >39)
LDL Chol Calc (NIH): 119 mg/dL — ABNORMAL HIGH (ref 0–99)
Triglycerides: 100 mg/dL (ref 0–149)
VLDL Cholesterol Cal: 18 mg/dL (ref 5–40)

## 2022-06-09 ENCOUNTER — Ambulatory Visit: Payer: Self-pay | Admitting: Gerontology

## 2022-06-09 ENCOUNTER — Ambulatory Visit: Payer: Medicaid Other | Admitting: Rheumatology

## 2022-06-16 ENCOUNTER — Encounter: Payer: Self-pay | Admitting: Gerontology

## 2022-06-16 ENCOUNTER — Other Ambulatory Visit: Payer: Self-pay

## 2022-06-16 ENCOUNTER — Ambulatory Visit: Payer: Medicaid Other | Admitting: Gerontology

## 2022-06-16 VITALS — BP 117/78 | HR 82 | Temp 83.0°F | Wt 167.2 lb

## 2022-06-16 DIAGNOSIS — I10 Essential (primary) hypertension: Secondary | ICD-10-CM | POA: Insufficient documentation

## 2022-06-16 DIAGNOSIS — I6521 Occlusion and stenosis of right carotid artery: Secondary | ICD-10-CM

## 2022-06-16 DIAGNOSIS — Z8679 Personal history of other diseases of the circulatory system: Secondary | ICD-10-CM

## 2022-06-16 MED ORDER — ATORVASTATIN CALCIUM 10 MG PO TABS
10.0000 mg | ORAL_TABLET | Freq: Every day | ORAL | 1 refills | Status: DC
  Filled 2022-06-16 (×2): qty 90, 90d supply, fill #0

## 2022-06-16 MED ORDER — LOSARTAN POTASSIUM 25 MG PO TABS
25.0000 mg | ORAL_TABLET | ORAL | 1 refills | Status: DC
  Filled 2022-06-16: qty 90, fill #0
  Filled 2022-06-16: qty 90, 90d supply, fill #0
  Filled 2022-10-14: qty 90, 90d supply, fill #1

## 2022-06-16 MED ORDER — CARVEDILOL 3.125 MG PO TABS
3.1250 mg | ORAL_TABLET | Freq: Two times a day (BID) | ORAL | 2 refills | Status: DC
  Filled 2022-06-16: qty 60, 30d supply, fill #0
  Filled 2022-06-16: qty 180, 90d supply, fill #0
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
## 2022-06-16 NOTE — Patient Instructions (Signed)

## 2022-06-23 ENCOUNTER — Other Ambulatory Visit: Payer: Self-pay

## 2022-06-26 ENCOUNTER — Other Ambulatory Visit: Payer: Self-pay

## 2022-07-01 ENCOUNTER — Ambulatory Visit: Payer: Medicaid Other | Admitting: Gerontology

## 2022-07-24 ENCOUNTER — Emergency Department: Payer: Medicaid Other

## 2022-07-24 DIAGNOSIS — W01198A Fall on same level from slipping, tripping and stumbling with subsequent striking against other object, initial encounter: Secondary | ICD-10-CM | POA: Insufficient documentation

## 2022-07-24 DIAGNOSIS — S01111A Laceration without foreign body of right eyelid and periocular area, initial encounter: Secondary | ICD-10-CM | POA: Diagnosis not present

## 2022-07-24 DIAGNOSIS — R509 Fever, unspecified: Secondary | ICD-10-CM | POA: Diagnosis not present

## 2022-07-24 DIAGNOSIS — Y92810 Car as the place of occurrence of the external cause: Secondary | ICD-10-CM | POA: Diagnosis not present

## 2022-07-24 DIAGNOSIS — R000 Tachycardia, unspecified: Secondary | ICD-10-CM | POA: Diagnosis not present

## 2022-07-24 DIAGNOSIS — Z20822 Contact with and (suspected) exposure to covid-19: Secondary | ICD-10-CM | POA: Diagnosis not present

## 2022-07-24 DIAGNOSIS — R55 Syncope and collapse: Secondary | ICD-10-CM | POA: Diagnosis not present

## 2022-07-24 DIAGNOSIS — S0993XA Unspecified injury of face, initial encounter: Secondary | ICD-10-CM | POA: Diagnosis present

## 2022-07-24 DIAGNOSIS — Z23 Encounter for immunization: Secondary | ICD-10-CM | POA: Insufficient documentation

## 2022-07-24 LAB — BASIC METABOLIC PANEL
Anion gap: 6 (ref 5–15)
BUN: 19 mg/dL (ref 6–20)
CO2: 23 mmol/L (ref 22–32)
Calcium: 8.4 mg/dL — ABNORMAL LOW (ref 8.9–10.3)
Chloride: 109 mmol/L (ref 98–111)
Creatinine, Ser: 1.09 mg/dL — ABNORMAL HIGH (ref 0.44–1.00)
GFR, Estimated: >60 mL/min (ref >60)
Glucose, Bld: 100 mg/dL — ABNORMAL HIGH (ref 70–99)
Potassium: 4 mmol/L (ref 3.5–5.1)
Sodium: 138 mmol/L (ref 135–145)

## 2022-07-24 LAB — CBC
HCT: 42.8 % (ref 36.0–46.0)
Hemoglobin: 13.5 g/dL (ref 12.0–15.0)
MCH: 31.8 pg (ref 26.0–34.0)
MCHC: 31.5 g/dL (ref 30.0–36.0)
MCV: 100.7 fL — ABNORMAL HIGH (ref 80.0–100.0)
Platelets: 178 10*3/uL (ref 150–400)
RBC: 4.25 MIL/uL (ref 3.87–5.11)
RDW: 12.5 % (ref 11.5–15.5)
WBC: 7.3 10*3/uL (ref 4.0–10.5)
nRBC: 0 % (ref 0.0–0.2)

## 2022-07-24 LAB — TROPONIN I (HIGH SENSITIVITY)
Troponin I (High Sensitivity): 5 ng/L (ref <18)
Troponin I (High Sensitivity): 4 ng/L (ref <18)

## 2022-07-24 NOTE — ED Provider Triage Note (Signed)
Emergency Medicine Provider Triage Evaluation Note  Christina Avery, a 56 y.o. female  was evaluated in triage.  Pt complains of syncopal episode while walking into the house.  Patient apparently fell in her driveway, causing an abrasion over her right eye.  She reports similar episodes in the past.  She denies any pre-existing workup for her syncopal episodes.  Review of Systems  Positive: Syncope, brow lac Negative: FCS  Physical Exam  There were no vitals taken for this visit. Gen:   Awake, no distress  NAD Resp:  Normal effort CTA MSK:   Moves extremities without difficulty  Other:    Medical Decision Making  Medically screening exam initiated at 5:30 PM.  Appropriate orders placed.  Christina Avery was informed that the remainder of the evaluation will be completed by another provider, this initial triage assessment does not replace that evaluation, and the importance of remaining in the ED until their evaluation is complete.  Patient to the ED for evaluation of injuries sustained following a syncopal episode and a fall.  She reports abrasion over the right brow.   Lissa Hoard, PA-C 07/24/22 1754

## 2022-07-24 NOTE — ED Triage Notes (Signed)
First Nurse Note: Pt presents via POV with complaints of a fall that happened tonight. Pt states she felt hot and fell hitting her head on cement. She is unsure if she had LOC - not on thinners. Denies CP or SOB.

## 2022-07-24 NOTE — ED Notes (Signed)
Pt was stuck twice. Pt was encourage to give urine sample

## 2022-07-25 ENCOUNTER — Emergency Department
Admission: EM | Admit: 2022-07-25 | Discharge: 2022-07-25 | Disposition: A | Discharge status: Home / Self Care | Payer: Medicaid Other | Attending: Emergency Medicine | Admitting: Emergency Medicine

## 2022-07-25 DIAGNOSIS — S0181XA Laceration without foreign body of other part of head, initial encounter: Secondary | ICD-10-CM

## 2022-07-25 DIAGNOSIS — W19XXXA Unspecified fall, initial encounter: Secondary | ICD-10-CM

## 2022-07-25 DIAGNOSIS — R55 Syncope and collapse: Secondary | ICD-10-CM

## 2022-07-25 DIAGNOSIS — R509 Fever, unspecified: Secondary | ICD-10-CM

## 2022-07-25 LAB — RESP PANEL BY RT-PCR (RSV, FLU A&B, COVID)  RVPGX2
Influenza A by PCR: NEGATIVE
Influenza B by PCR: NEGATIVE
Resp Syncytial Virus by PCR: NEGATIVE
SARS Coronavirus 2 by RT PCR: NEGATIVE

## 2022-07-25 MED ORDER — ACETAMINOPHEN 500 MG PO TABS
1000.0000 mg | ORAL_TABLET | Freq: Once | ORAL | Status: AC
  Administered 2022-07-25: 1000 mg via ORAL
  Filled 2022-07-25: qty 2

## 2022-07-25 MED ORDER — TETANUS-DIPHTH-ACELL PERTUSSIS 5-2.5-18.5 LF-MCG/0.5 IM SUSY
0.5000 mL | PREFILLED_SYRINGE | Freq: Once | INTRAMUSCULAR | Status: AC
  Administered 2022-07-25: 0.5 mL via INTRAMUSCULAR
  Filled 2022-07-25: qty 0.5

## 2022-07-25 NOTE — ED Notes (Signed)
E-signature pad unavailable - Pt verbalized understanding of D/C information - no additional concerns at this time.  

## 2022-07-25 NOTE — Discharge Instructions (Addendum)
You have been seen in the Emergency Department (ED) today for a fall.  Your work up does not show any concerning injuries.  Please take over-the-counter ibuprofen and/or Tylenol as needed for your pain (unless you have an allergy or your doctor as told you not to take them), or take any prescribed medication as instructed.  As we discussed, you had a mild fever tonight, but without any other symptoms to go along with it.  Everything we tested was negative (including for COVID, influenza, and RSV), and you declined providing a urine specimen at this time which is completely acceptable, particular given that you are not having burning when you urinate.  But please note that you likely have a mild viral illness and you need to drink plenty of fluids, take ibuprofen and Tylenol, etc.  Please follow up with your doctor regarding today's Emergency Department (ED) visit and your recent fall.  If you do not have a current primary care provider, please look through the list provided below and follow the instructions to get set up with a local doctor.  Return to the ED if you have any headache, confusion, slurred speech, weakness/numbness of any arm or leg, or any increased pain.  Please go to the following website to schedule new (and existing) patient appointments:   [email or web address]   The following is a list of primary care offices in the area who are accepting new patients at this time.  Please reach out to one of them directly and let them know you would like to schedule an appointment to follow up on an Emergency Department visit, and/or to establish a new primary care provider (PCP).  There are likely other primary care clinics in the are who are accepting new patients, but this is an excellent place to start:  [facility] Lead physician: Dr Shirlee Latch [address] [place], [place] [address] [phone]  [facility] Lead  Physician: Dr Alba  [address], [place], [place] [address] [phone]  [facility]  Lead Physician: Dr Olevia Perches [address] [place], [place] [address] [phone]  [facility] Lead Physician: Dr Sofie Hartigan [address] [place], [place] [address] [phone]  [facility] Lead Physician: Dr Bari Edward [address] [place], [place], [place] [address] [phone]

## 2022-07-25 NOTE — ED Provider Notes (Signed)
[facility] Provider Note    Event Date/Time   First MD Initiated Contact with Patient 07/25/22 [phone]     (approximate)   History   Near Syncope   HPI  Christina Avery is a 56 y.o. female who presents by EMS for evaluation after a fall.  She states that she passed out when she was getting out of a car and hit her head on the concrete.  She said that this, thing is happened to her before and she is even been admitted to the hospital for it.  She said that she has had no recent infectious symptoms.  She specifically denied (prior to the fall) headache, congestion, sore throat, chest pain, shortness of breath, cough, nausea, vomiting, abdominal pain, and dysuria or increased urinary frequency.  She said that she just got real flushed and overheated and then passed out very briefly.  No seizure-like activity, no numbness nor tingling in her extremities.  She now has a headache and she "tore up my face" when she fell, but otherwise she has no complaints or concerns and says that she mostly just wants to lie down to go to sleep.  She has been waiting for an extended period of time to be evaluated.     Physical Exam   Triage Vital Signs: ED Triage Vitals  Enc Vitals Group     BP 07/24/22 1734 100/66     Pulse Rate 07/24/22 1734 73     Resp 07/24/22 1734 18     Temp 07/24/22 1753 98 F (36.7 C)     Temp Source 07/24/22 1753 Oral     SpO2 07/24/22 1734 100 %     Weight --      Height --      Head Circumference --      Peak Flow --      Pain Score 07/24/22 1735 0     Pain Loc --      Pain Edu? --      Excl. in [place]? --     Most recent vital signs: Vitals:   07/24/22 2213 07/25/22 0352  BP: (!) 126/97 122/89  Pulse: (!) 109 (!) 103  Resp: 17 18  Temp: (!) 100.6 F (38.1 C) 98.8 F (37.1 C)  SpO2: 96% 97%     General: Awake, alert.  Obvious trauma to the right part of her face/forehead, otherwise generally reassuring. CV:  Good peripheral perfusion.   Borderline tachycardia, regular rhythm, normal heart sounds. Resp:  Normal effort.  Lungs are clear to auscultation.  Speaking easily and comfortably.  No accessory muscle usage. Abd:  No distention.  No tenderness to palpation the abdomen. Other:   Venous wounds to her extremities.  Normal use and range of motion of her arms and legs.  She has a contusion to the right orbit with some swelling of the right upper eyelid and a small 8 mm laceration above the right eye as documented below.  The wound appears clean and uncontaminated.  She has an abraded area below the right eye but without any lacerations to repair.   ED Results / Procedures / Treatments   Labs (all labs ordered are listed, but only abnormal results are displayed) Labs Reviewed  BASIC METABOLIC PANEL - Abnormal; Notable for the following components:      Result Value   Glucose, Bld 100 (*)    Creatinine, Ser 1.09 (*)    Calcium 8.4 (*)    All  other components within normal limits  CBC - Abnormal; Notable for the following components:   MCV 100.7 (*)    All other components within normal limits  RESP PANEL BY RT-PCR (RSV, FLU A&B, COVID)  RVPGX2  URINALYSIS, ROUTINE W REFLEX MICROSCOPIC  CBG MONITORING, ED  POC URINE PREG, ED  TROPONIN I (HIGH SENSITIVITY)  TROPONIN I (HIGH SENSITIVITY)     EKG  ED ECG REPORT I, Loleta Rose, the attending physician, personally viewed and interpreted this ECG.  Date: 07/24/2022 EKG Time: 18: 14 Rate: 72 Rhythm: normal sinus rhythm QRS Axis: normal Intervals: normal ST/T Wave abnormalities: Non-specific ST segment / T-wave changes, but no clear evidence of acute ischemia. Narrative Interpretation: no definitive evidence of acute ischemia; does not meet STEMI criteria.    RADIOLOGY I viewed and interpreted the patient's CT head and CT cervical spine.  I see no evidence of intracranial hemorrhage, skull fracture, or cervical spine injury.  I also read the radiologist's report,  which confirmed no acute findings.    PROCEDURES:  Critical Care performed: No  ..Laceration Repair  Date/Time: 07/25/2022 3:51 AM  Performed by: Loleta Rose, MD Authorized by: Loleta Rose, MD   Consent:    Consent obtained:  Verbal   Consent given by:  Patient   Risks discussed:  Infection and poor cosmetic result Universal protocol:    Patient identity confirmed:  Verbally with patient Anesthesia:    Anesthesia method:  None Laceration details:    Location:  Face   Face location:  R upper eyelid   Extent:  Partial thickness   Length (cm):  0.8 Pre-procedure details:    Preparation:  Imaging obtained to evaluate for foreign bodies Exploration:    Contaminated: no   Treatment:    Area cleansed with:  Saline   Amount of cleaning:  Standard   Irrigation solution:  Sterile saline Skin repair:    Repair method:  Steri-Strips and tissue adhesive   Number of Steri-Strips:  1 Approximation:    Approximation:  Close Repair type:    Repair type:  Simple Post-procedure details:    Dressing:  Open (no dressing)   Procedure completion:  Tolerated well, no immediate complications    MEDICATIONS ORDERED IN ED: Medications  Tdap (BOOSTRIX) injection 0.5 mL (0.5 mLs Intramuscular Given 07/25/22 0350)  acetaminophen (TYLENOL) tablet 1,000 mg (1,000 mg Oral Given 07/25/22 0412)     IMPRESSION / MDM / ASSESSMENT AND PLAN / ED COURSE  I reviewed the triage vital signs and the nursing notes.                              Differential diagnosis includes, but is not limited to, cardiogenic syncope, vasovagal episode, medication or drug side effect, electrolyte or metabolic abnormality, acute infectious process.  Patient's presentation is most consistent with acute presentation with potential threat to life or bodily function.  Labs/studies ordered: EKG, head CT, cervical spine CT, CBC, respiratory viral panel, high-sensitivity troponin x 2, basic metabolic panel.   Urinalysis was ordered but a urine specimen was not provided by the patient.  Vital signs are notable for very mild fever Tmax 100.6.  When her temperature was slightly elevated she had a slight tachycardia, otherwise normal.  As documented above, EKG and CT scans are reassuring.  I ordered Tylenol 1000 mg and a Tdap because she does not know the date of her last tetanus vaccination.  I read the  discharge summary from a hospitalization in November 2021 written [facility] hospitalist.  She commented that the patient was admitted for syncope.  She had an overall reassuring workup without a specific cause identified.  She had an echocardiogram showing an EF of 40 to 45%.  It was thought that her polysubstance abuse was likely contributing to the syncopal episode.  I talked with the patient about these findings and she confirmed prior hospitalization and no specific explanation for her syncopal episode.  She said that she is no longer using illicit substances.  I offered to check her urine given that she has a mild fever but she declined.  She said she has no other complaints or concerns and understands that she likely has a mild viral illness given the fever but she wants to go home.  I considered hospitalization but since she has been hospitalized before for similar symptoms (syncope), she has no emergent traumatic injury from her fall, and she is declining additional infectious workup, I think it is appropriate for her to be discharged for outpatient follow-up.  She said that she does not have a PCP at this time so I provided her with a list of local PCPs who are accepting new patients.  I gave my usual and customary return precautions.        FINAL CLINICAL IMPRESSION(S) / ED DIAGNOSES   Final diagnoses:  Fall, initial encounter  Syncope and collapse  Facial laceration, initial encounter  Fever, unspecified fever cause     Rx / DC Orders   ED Discharge Orders     None        Note:   This document was prepared using Dragon voice recognition software and may include unintentional dictation errors.   Loleta Rose, MD 07/25/22 [phone]

## 2022-07-25 NOTE — ED Notes (Signed)
Pt called for repeat VS - no answer

## 2022-09-01 DIAGNOSIS — I5020 Unspecified systolic (congestive) heart failure: Secondary | ICD-10-CM | POA: Diagnosis not present

## 2022-09-01 DIAGNOSIS — I10 Essential (primary) hypertension: Secondary | ICD-10-CM | POA: Diagnosis not present

## 2022-09-01 DIAGNOSIS — R55 Syncope and collapse: Secondary | ICD-10-CM | POA: Diagnosis not present

## 2022-10-13 ENCOUNTER — Other Ambulatory Visit: Payer: Self-pay

## 2022-10-13 ENCOUNTER — Emergency Department
Admission: EM | Admit: 2022-10-13 | Discharge: 2022-10-13 | Disposition: A | Discharge status: Home / Self Care | Payer: Medicaid Other | Attending: Emergency Medicine | Admitting: Emergency Medicine

## 2022-10-13 DIAGNOSIS — R531 Weakness: Secondary | ICD-10-CM | POA: Diagnosis present

## 2022-10-13 DIAGNOSIS — I491 Atrial premature depolarization: Secondary | ICD-10-CM

## 2022-10-13 DIAGNOSIS — R55 Syncope and collapse: Secondary | ICD-10-CM | POA: Insufficient documentation

## 2022-10-13 LAB — BASIC METABOLIC PANEL
Anion gap: 10 (ref 5–15)
BUN: 19 mg/dL (ref 6–20)
CO2: 22 mmol/L (ref 22–32)
Calcium: 8.8 mg/dL — ABNORMAL LOW (ref 8.9–10.3)
Chloride: 108 mmol/L (ref 98–111)
Creatinine, Ser: 1.02 mg/dL — ABNORMAL HIGH (ref 0.44–1.00)
GFR, Estimated: >60 mL/min (ref >60)
Glucose, Bld: 102 mg/dL — ABNORMAL HIGH (ref 70–99)
Potassium: 2.9 mmol/L — ABNORMAL LOW (ref 3.5–5.1)
Sodium: 140 mmol/L (ref 135–145)

## 2022-10-13 LAB — MAGNESIUM: Magnesium: 2 mg/dL (ref 1.7–2.4)

## 2022-10-13 LAB — CBC WITH DIFFERENTIAL/PLATELET
Abs Immature Granulocytes: 0.02 10*3/uL (ref 0.00–0.07)
Basophils Absolute: 0 10*3/uL (ref 0.0–0.1)
Basophils Relative: 1 %
Eosinophils Absolute: 0 10*3/uL (ref 0.0–0.5)
Eosinophils Relative: 1 %
HCT: 37.1 % (ref 36.0–46.0)
Hemoglobin: 11.9 g/dL — ABNORMAL LOW (ref 12.0–15.0)
Immature Granulocytes: 0 %
Lymphocytes Relative: 24 %
Lymphs Abs: 1.8 10*3/uL (ref 0.7–4.0)
MCH: 32.1 pg (ref 26.0–34.0)
MCHC: 32.1 g/dL (ref 30.0–36.0)
MCV: 100 fL (ref 80.0–100.0)
Monocytes Absolute: 0.5 10*3/uL (ref 0.1–1.0)
Monocytes Relative: 6 %
Neutro Abs: 5.4 10*3/uL (ref 1.7–7.7)
Neutrophils Relative %: 68 %
Platelets: 191 10*3/uL (ref 150–400)
RBC: 3.71 MIL/uL — ABNORMAL LOW (ref 3.87–5.11)
RDW: 12.1 % (ref 11.5–15.5)
WBC: 7.8 10*3/uL (ref 4.0–10.5)
nRBC: 0 % (ref 0.0–0.2)

## 2022-10-13 LAB — TROPONIN I (HIGH SENSITIVITY)
Troponin I (High Sensitivity): 7 ng/L (ref <18)
Troponin I (High Sensitivity): 6 ng/L (ref <18)

## 2022-10-13 MED ORDER — POTASSIUM CHLORIDE CRYS ER 20 MEQ PO TBCR
40.0000 meq | EXTENDED_RELEASE_TABLET | Freq: Once | ORAL | Status: AC
  Administered 2022-10-13: 40 meq via ORAL
  Filled 2022-10-13: qty 2

## 2022-10-13 NOTE — ED Notes (Signed)
Patient provided sandwich tray. 

## 2022-10-13 NOTE — ED Triage Notes (Signed)
Patient had teeth removed yesterday at a clinic. She was prescribed amoxicillin, hydrocodone and ibuprofen. Patient took meds as ordered this morning went across town to friends place and suddenly felt very dizzy and weak. Stated the episode lasted about 30 minutes. EMS BP was 150/120 originally then when patient stood up BP dropped to 80/50 then went back up to 106/70, all other vitals normal. Pt got 232m of NS. Denies fevers or any sickness recently. Pt also has hx of HTN.

## 2022-10-13 NOTE — ED Provider Notes (Signed)
-----------------------------------------   3:15 PM on 10/13/2022 -----------------------------------------  Blood pressure (!) 140/89, pulse 66, temperature 97.8 F (36.6 C), temperature source Oral, resp. rate 10, height 6\' 3"  (1.905 m), weight 77.6 kg, SpO2 100 %.  Assuming care from Dr. Archie Balboa.  In short, Christina Avery is a 57 y.o. female with a chief complaint of Weakness and Dental Pain .  Refer to the original H&P for additional details.  The current plan of care is to follow-up repeat troponin, observe for arrhythmia.  ----------------------------------------- 5:23 PM on 10/13/2022 ----------------------------------------- Repeat troponin within normal limits, patient with occasional PACs on monitor but otherwise no evidence of arrhythmia.  She is asymptomatic on reassessment, appropriate for discharge home with PCP follow-up.  She was counseled to return to the ED for new or worsening symptoms, patient agrees with plan.    Blake Divine, MD 10/13/22 1725

## 2022-10-13 NOTE — ED Provider Notes (Signed)
[facility] Provider Note    Event Date/Time   First MD Initiated Contact with Patient 10/13/22 1353     (approximate)   History   Weakness   HPI  Christina Avery is a 57 y.o. female who presents to the emergency department today because of concerns for an episode of weakness.  The patient states that she had multiple teeth removed yesterday.  Today she took her home medication as well as an ibuprofen and oxycodone.  About 30 minutes later when she was visiting a friend that she started feeling weak.  She stated she felt out of it and felt like everything was moving in slow motion.  She denies any associated headache or chest pain during this episode.  The episode lasted about 30 minutes and at the time my exam she felt back to normal.  She denies similar episode in the past although has passed out once in the past.  The patient denies any recent illness or fevers.     Physical Exam   Triage Vital Signs: ED Triage Vitals  Enc Vitals Group     BP 10/13/22 1347 112/71     Pulse Rate 10/13/22 1347 (!) 54     Resp 10/13/22 1347 16     Temp 10/13/22 1347 97.8 F (36.6 C)     Temp Source 10/13/22 1347 Oral     SpO2 10/13/22 1347 100 %     Weight 10/13/22 1348 171 lb (77.6 kg)     Height 10/13/22 1348 '6\' 3"'$  (1.905 m)     Head Circumference --      Peak Flow --      Pain Score 10/13/22 1344 8     Pain Loc --      Pain Edu? --      Excl. in [place]? --     Most recent vital signs: Vitals:   10/13/22 1347  BP: 112/71  Pulse: (!) 54  Resp: 16  Temp: 97.8 F (36.6 C)  SpO2: 100%    General: Awake, alert, oriented. CV:  Good peripheral perfusion. Bradycardia. Resp:  Normal effort. Lungs clear. Abd:  No distention.     ED Results / Procedures / Treatments   Labs (all labs ordered are listed, but only abnormal results are displayed) Labs Reviewed - No data to display   EKG  I, Nance Pear, attending physician, personally viewed and  interpreted this EKG  EKG Time: 1428 Rate: 92 Rhythm: sinus rhythm with frequent premature beats Axis: normal Intervals: qtc 501 QRS: narrow ST changes: no st elevation Impression: abnormal ekg   RADIOLOGY None  PROCEDURES:  Critical Care performed: No    MEDICATIONS ORDERED IN ED: Medications - No data to display   IMPRESSION / MDM / [place] / ED COURSE  I reviewed the triage vital signs and the nursing notes.                              Differential diagnosis includes, but is not limited to, arrhythmia, anemia, electrolyte abnormality  Patient's presentation is most consistent with acute presentation with potential threat to life or bodily function.   The patient is on the cardiac monitor to evaluate for evidence of arrhythmia and/or significant heart rate changes.  Patient presented to the emergency department today because of concern for roughly 30-minute episode of feeling lightheaded and out of it.  At the time my exam  she states she feels better.  EKG does show sinus rhythm with what appears to be frequent premature beats.  Blood work with some hypokalemia.  Initial troponin negative.  Will plan on obtaining second troponin and keeping patient on cardiac monitor to evaluate for any arrhythmias.      FINAL CLINICAL IMPRESSION(S) / ED DIAGNOSES   Near syncope     Note:  This document was prepared using Dragon voice recognition software and may include unintentional dictation errors.    Nance Pear, MD 10/13/22 1520

## 2022-10-14 ENCOUNTER — Other Ambulatory Visit: Payer: Self-pay

## 2022-11-02 DEATH — deceased
